# Patient Record
Sex: Female | Born: 1965
Health system: Southern US, Community
[De-identification: ages and names within clinical notes are randomized; demographics above are authoritative.]

## PROBLEM LIST (undated history)

## (undated) DIAGNOSIS — E669 Obesity, unspecified: Secondary | ICD-10-CM

## (undated) DIAGNOSIS — F32A Depression, unspecified: Secondary | ICD-10-CM

## (undated) DIAGNOSIS — Z79891 Long term (current) use of opiate analgesic: Secondary | ICD-10-CM

## (undated) DIAGNOSIS — M797 Fibromyalgia: Secondary | ICD-10-CM

## (undated) DIAGNOSIS — F909 Attention-deficit hyperactivity disorder, unspecified type: Secondary | ICD-10-CM

## (undated) DIAGNOSIS — I10 Essential (primary) hypertension: Secondary | ICD-10-CM

## (undated) DIAGNOSIS — E785 Hyperlipidemia, unspecified: Secondary | ICD-10-CM

## (undated) DIAGNOSIS — E8881 Metabolic syndrome: Secondary | ICD-10-CM

## (undated) DIAGNOSIS — K219 Gastro-esophageal reflux disease without esophagitis: Secondary | ICD-10-CM

## (undated) DIAGNOSIS — F329 Major depressive disorder, single episode, unspecified: Secondary | ICD-10-CM

## (undated) DIAGNOSIS — G4730 Sleep apnea, unspecified: Secondary | ICD-10-CM

## (undated) DIAGNOSIS — G43909 Migraine, unspecified, not intractable, without status migrainosus: Secondary | ICD-10-CM

## (undated) HISTORY — DX: Metabolic syndrome: E88.81

## (undated) HISTORY — DX: Depression, unspecified: F32.A

## (undated) HISTORY — DX: Migraine, unspecified, not intractable, without status migrainosus: G43.909

## (undated) HISTORY — DX: Hyperlipidemia, unspecified: E78.5

## (undated) HISTORY — DX: Gastro-esophageal reflux disease without esophagitis: K21.9

## (undated) HISTORY — DX: Essential (primary) hypertension: I10

## (undated) HISTORY — DX: Obesity, unspecified: E66.9

## (undated) HISTORY — DX: Major depressive disorder, single episode, unspecified: F32.9

## (undated) HISTORY — DX: Sleep apnea, unspecified: G47.30

## (undated) HISTORY — DX: Long term (current) use of opiate analgesic: Z79.891

## (undated) HISTORY — DX: Attention-deficit hyperactivity disorder, unspecified type: F90.9

## (undated) HISTORY — DX: Fibromyalgia: M79.7

---

## 2001-02-03 ENCOUNTER — Other Ambulatory Visit: Admission: RE | Admit: 2001-02-03 | Discharge: 2001-02-03 | Payer: Self-pay | Admitting: Family Medicine

## 2001-06-18 HISTORY — PX: OTHER SURGICAL HISTORY: SHX169

## 2002-05-06 ENCOUNTER — Ambulatory Visit (HOSPITAL_COMMUNITY): Admission: RE | Admit: 2002-05-06 | Discharge: 2002-05-06 | Payer: Self-pay | Admitting: Family Medicine

## 2002-05-06 ENCOUNTER — Encounter: Payer: Self-pay | Admitting: Family Medicine

## 2002-05-12 ENCOUNTER — Ambulatory Visit (HOSPITAL_COMMUNITY): Admission: RE | Admit: 2002-05-12 | Discharge: 2002-05-12 | Payer: Self-pay | Admitting: Interventional Radiology

## 2002-05-22 ENCOUNTER — Inpatient Hospital Stay (HOSPITAL_COMMUNITY): Admission: RE | Admit: 2002-05-22 | Discharge: 2002-05-22 | Payer: Self-pay | Admitting: Interventional Radiology

## 2002-07-31 ENCOUNTER — Ambulatory Visit (HOSPITAL_COMMUNITY): Admission: RE | Admit: 2002-07-31 | Discharge: 2002-07-31 | Payer: Self-pay | Admitting: Interventional Radiology

## 2002-09-01 ENCOUNTER — Ambulatory Visit (HOSPITAL_COMMUNITY): Admission: RE | Admit: 2002-09-01 | Discharge: 2002-09-01 | Payer: Self-pay | Admitting: Interventional Radiology

## 2002-09-29 ENCOUNTER — Other Ambulatory Visit: Admission: RE | Admit: 2002-09-29 | Discharge: 2002-09-29 | Payer: Self-pay | Admitting: *Deleted

## 2002-11-16 ENCOUNTER — Ambulatory Visit (HOSPITAL_COMMUNITY): Admission: RE | Admit: 2002-11-16 | Discharge: 2002-11-16 | Payer: Self-pay | Admitting: Interventional Radiology

## 2003-03-05 ENCOUNTER — Ambulatory Visit (HOSPITAL_COMMUNITY): Admission: RE | Admit: 2003-03-05 | Discharge: 2003-03-05 | Payer: Self-pay | Admitting: Interventional Radiology

## 2003-06-04 ENCOUNTER — Ambulatory Visit (HOSPITAL_COMMUNITY): Admission: RE | Admit: 2003-06-04 | Discharge: 2003-06-04 | Payer: Self-pay | Admitting: Interventional Radiology

## 2003-12-31 ENCOUNTER — Ambulatory Visit (HOSPITAL_COMMUNITY): Admission: RE | Admit: 2003-12-31 | Discharge: 2003-12-31 | Payer: Self-pay | Admitting: Interventional Radiology

## 2004-09-11 ENCOUNTER — Ambulatory Visit (HOSPITAL_COMMUNITY): Admission: RE | Admit: 2004-09-11 | Discharge: 2004-09-11 | Payer: Self-pay | Admitting: Interventional Radiology

## 2005-01-02 ENCOUNTER — Ambulatory Visit (HOSPITAL_COMMUNITY): Admission: RE | Admit: 2005-01-02 | Discharge: 2005-01-02 | Payer: Self-pay | Admitting: Interventional Radiology

## 2005-06-20 ENCOUNTER — Ambulatory Visit (HOSPITAL_COMMUNITY): Admission: RE | Admit: 2005-06-20 | Discharge: 2005-06-20 | Payer: Self-pay | Admitting: Interventional Radiology

## 2006-07-18 ENCOUNTER — Ambulatory Visit (HOSPITAL_COMMUNITY): Admission: RE | Admit: 2006-07-18 | Discharge: 2006-07-18 | Payer: Self-pay | Admitting: Interventional Radiology

## 2010-10-04 ENCOUNTER — Ambulatory Visit (HOSPITAL_COMMUNITY)
Admission: RE | Admit: 2010-10-04 | Discharge: 2010-10-04 | Disposition: A | Discharge status: Home / Self Care | Payer: BC Managed Care – PPO | Source: Ambulatory Visit | Attending: Interventional Radiology | Admitting: Interventional Radiology

## 2010-10-04 ENCOUNTER — Other Ambulatory Visit (HOSPITAL_COMMUNITY): Payer: Self-pay | Admitting: Interventional Radiology

## 2010-10-04 DIAGNOSIS — G44009 Cluster headache syndrome, unspecified, not intractable: Secondary | ICD-10-CM

## 2010-10-04 DIAGNOSIS — I729 Aneurysm of unspecified site: Secondary | ICD-10-CM

## 2010-10-04 DIAGNOSIS — R51 Headache: Secondary | ICD-10-CM | POA: Insufficient documentation

## 2010-10-04 DIAGNOSIS — G932 Benign intracranial hypertension: Secondary | ICD-10-CM

## 2010-10-04 DIAGNOSIS — I671 Cerebral aneurysm, nonruptured: Secondary | ICD-10-CM | POA: Insufficient documentation

## 2010-10-04 LAB — BUN: BUN: 11 mg/dL (ref 6–23)

## 2010-10-09 ENCOUNTER — Ambulatory Visit (HOSPITAL_COMMUNITY): Admission: RE | Admit: 2010-10-09 | Payer: BC Managed Care – PPO | Source: Ambulatory Visit

## 2010-10-26 ENCOUNTER — Other Ambulatory Visit (HOSPITAL_COMMUNITY): Payer: Self-pay | Admitting: Interventional Radiology

## 2010-10-26 DIAGNOSIS — I729 Aneurysm of unspecified site: Secondary | ICD-10-CM

## 2010-10-30 ENCOUNTER — Ambulatory Visit (HOSPITAL_COMMUNITY): Payer: BC Managed Care – PPO

## 2010-10-30 ENCOUNTER — Ambulatory Visit (HOSPITAL_COMMUNITY): Admission: RE | Admit: 2010-10-30 | Payer: BC Managed Care – PPO | Source: Ambulatory Visit

## 2016-08-09 DIAGNOSIS — E782 Mixed hyperlipidemia: Secondary | ICD-10-CM | POA: Diagnosis not present

## 2016-08-09 DIAGNOSIS — F900 Attention-deficit hyperactivity disorder, predominantly inattentive type: Secondary | ICD-10-CM | POA: Diagnosis not present

## 2016-08-09 DIAGNOSIS — Z9189 Other specified personal risk factors, not elsewhere classified: Secondary | ICD-10-CM | POA: Diagnosis not present

## 2016-08-09 DIAGNOSIS — F331 Major depressive disorder, recurrent, moderate: Secondary | ICD-10-CM | POA: Diagnosis not present

## 2016-08-09 DIAGNOSIS — I10 Essential (primary) hypertension: Secondary | ICD-10-CM | POA: Diagnosis not present

## 2016-08-09 DIAGNOSIS — K210 Gastro-esophageal reflux disease with esophagitis: Secondary | ICD-10-CM | POA: Diagnosis not present

## 2016-08-09 DIAGNOSIS — G4733 Obstructive sleep apnea (adult) (pediatric): Secondary | ICD-10-CM | POA: Diagnosis not present

## 2016-08-09 DIAGNOSIS — E8881 Metabolic syndrome: Secondary | ICD-10-CM | POA: Diagnosis not present

## 2016-08-14 DIAGNOSIS — F314 Bipolar disorder, current episode depressed, severe, without psychotic features: Secondary | ICD-10-CM | POA: Diagnosis not present

## 2016-09-04 DIAGNOSIS — L570 Actinic keratosis: Secondary | ICD-10-CM | POA: Diagnosis not present

## 2016-10-02 DIAGNOSIS — F314 Bipolar disorder, current episode depressed, severe, without psychotic features: Secondary | ICD-10-CM | POA: Diagnosis not present

## 2016-11-06 DIAGNOSIS — F331 Major depressive disorder, recurrent, moderate: Secondary | ICD-10-CM | POA: Diagnosis not present

## 2016-11-06 DIAGNOSIS — E782 Mixed hyperlipidemia: Secondary | ICD-10-CM | POA: Diagnosis not present

## 2016-11-06 DIAGNOSIS — Z6839 Body mass index (BMI) 39.0-39.9, adult: Secondary | ICD-10-CM | POA: Diagnosis not present

## 2016-11-06 DIAGNOSIS — I10 Essential (primary) hypertension: Secondary | ICD-10-CM | POA: Diagnosis not present

## 2016-11-06 DIAGNOSIS — E6609 Other obesity due to excess calories: Secondary | ICD-10-CM | POA: Diagnosis not present

## 2016-11-06 DIAGNOSIS — Z23 Encounter for immunization: Secondary | ICD-10-CM | POA: Diagnosis not present

## 2016-11-06 DIAGNOSIS — K219 Gastro-esophageal reflux disease without esophagitis: Secondary | ICD-10-CM | POA: Diagnosis not present

## 2016-11-06 DIAGNOSIS — G4733 Obstructive sleep apnea (adult) (pediatric): Secondary | ICD-10-CM | POA: Diagnosis not present

## 2016-11-06 DIAGNOSIS — E8881 Metabolic syndrome: Secondary | ICD-10-CM | POA: Diagnosis not present

## 2016-11-06 DIAGNOSIS — Z9189 Other specified personal risk factors, not elsewhere classified: Secondary | ICD-10-CM | POA: Diagnosis not present

## 2016-11-06 DIAGNOSIS — F900 Attention-deficit hyperactivity disorder, predominantly inattentive type: Secondary | ICD-10-CM | POA: Diagnosis not present

## 2016-11-06 DIAGNOSIS — M797 Fibromyalgia: Secondary | ICD-10-CM | POA: Diagnosis not present

## 2016-11-13 DIAGNOSIS — F314 Bipolar disorder, current episode depressed, severe, without psychotic features: Secondary | ICD-10-CM | POA: Diagnosis not present

## 2017-02-25 DIAGNOSIS — F314 Bipolar disorder, current episode depressed, severe, without psychotic features: Secondary | ICD-10-CM | POA: Diagnosis not present

## 2017-06-06 DIAGNOSIS — Z6839 Body mass index (BMI) 39.0-39.9, adult: Secondary | ICD-10-CM | POA: Diagnosis not present

## 2017-06-06 DIAGNOSIS — M722 Plantar fascial fibromatosis: Secondary | ICD-10-CM | POA: Diagnosis not present

## 2017-08-22 ENCOUNTER — Other Ambulatory Visit (HOSPITAL_COMMUNITY): Payer: Self-pay | Admitting: Family Medicine

## 2017-08-29 ENCOUNTER — Other Ambulatory Visit (HOSPITAL_COMMUNITY): Payer: Self-pay | Admitting: Family Medicine

## 2017-08-29 DIAGNOSIS — R51 Headache: Principal | ICD-10-CM

## 2017-08-29 DIAGNOSIS — R519 Headache, unspecified: Secondary | ICD-10-CM

## 2017-09-04 ENCOUNTER — Ambulatory Visit (HOSPITAL_COMMUNITY)
Admission: RE | Admit: 2017-09-04 | Discharge: 2017-09-04 | Disposition: A | Discharge status: Home / Self Care | Payer: Medicare PPO | Source: Ambulatory Visit | Attending: Family Medicine | Admitting: Family Medicine

## 2017-09-04 DIAGNOSIS — R519 Headache, unspecified: Secondary | ICD-10-CM

## 2017-09-04 DIAGNOSIS — G43919 Migraine, unspecified, intractable, without status migrainosus: Secondary | ICD-10-CM | POA: Diagnosis present

## 2017-09-04 DIAGNOSIS — R51 Headache: Secondary | ICD-10-CM

## 2017-09-04 DIAGNOSIS — I671 Cerebral aneurysm, nonruptured: Secondary | ICD-10-CM | POA: Insufficient documentation

## 2017-09-04 DIAGNOSIS — R9082 White matter disease, unspecified: Secondary | ICD-10-CM | POA: Diagnosis not present

## 2017-11-12 DIAGNOSIS — E8881 Metabolic syndrome: Secondary | ICD-10-CM | POA: Diagnosis not present

## 2017-11-12 DIAGNOSIS — Z79891 Long term (current) use of opiate analgesic: Secondary | ICD-10-CM | POA: Diagnosis not present

## 2017-11-12 DIAGNOSIS — F331 Major depressive disorder, recurrent, moderate: Secondary | ICD-10-CM | POA: Diagnosis not present

## 2017-11-12 DIAGNOSIS — I10 Essential (primary) hypertension: Secondary | ICD-10-CM | POA: Diagnosis not present

## 2017-11-12 DIAGNOSIS — K210 Gastro-esophageal reflux disease with esophagitis: Secondary | ICD-10-CM | POA: Diagnosis not present

## 2017-11-12 DIAGNOSIS — E782 Mixed hyperlipidemia: Secondary | ICD-10-CM | POA: Diagnosis not present

## 2017-11-12 DIAGNOSIS — G4733 Obstructive sleep apnea (adult) (pediatric): Secondary | ICD-10-CM | POA: Diagnosis not present

## 2017-11-12 DIAGNOSIS — Z9189 Other specified personal risk factors, not elsewhere classified: Secondary | ICD-10-CM | POA: Diagnosis not present

## 2017-11-14 DIAGNOSIS — F331 Major depressive disorder, recurrent, moderate: Secondary | ICD-10-CM | POA: Diagnosis not present

## 2017-11-14 DIAGNOSIS — I10 Essential (primary) hypertension: Secondary | ICD-10-CM | POA: Diagnosis not present

## 2017-11-14 DIAGNOSIS — E8881 Metabolic syndrome: Secondary | ICD-10-CM | POA: Diagnosis not present

## 2017-11-14 DIAGNOSIS — K219 Gastro-esophageal reflux disease without esophagitis: Secondary | ICD-10-CM | POA: Diagnosis not present

## 2017-11-14 DIAGNOSIS — R072 Precordial pain: Secondary | ICD-10-CM | POA: Diagnosis not present

## 2017-11-14 DIAGNOSIS — Z1389 Encounter for screening for other disorder: Secondary | ICD-10-CM | POA: Diagnosis not present

## 2017-11-14 DIAGNOSIS — M797 Fibromyalgia: Secondary | ICD-10-CM | POA: Diagnosis not present

## 2017-11-14 DIAGNOSIS — Z6835 Body mass index (BMI) 35.0-35.9, adult: Secondary | ICD-10-CM | POA: Diagnosis not present

## 2017-11-14 DIAGNOSIS — G4733 Obstructive sleep apnea (adult) (pediatric): Secondary | ICD-10-CM | POA: Diagnosis not present

## 2017-11-14 DIAGNOSIS — F900 Attention-deficit hyperactivity disorder, predominantly inattentive type: Secondary | ICD-10-CM | POA: Diagnosis not present

## 2017-11-14 DIAGNOSIS — Z1331 Encounter for screening for depression: Secondary | ICD-10-CM | POA: Diagnosis not present

## 2017-11-14 DIAGNOSIS — Z9189 Other specified personal risk factors, not elsewhere classified: Secondary | ICD-10-CM | POA: Diagnosis not present

## 2017-11-14 DIAGNOSIS — E782 Mixed hyperlipidemia: Secondary | ICD-10-CM | POA: Diagnosis not present

## 2017-11-19 DIAGNOSIS — Z1211 Encounter for screening for malignant neoplasm of colon: Secondary | ICD-10-CM | POA: Diagnosis not present

## 2017-11-19 DIAGNOSIS — K219 Gastro-esophageal reflux disease without esophagitis: Secondary | ICD-10-CM | POA: Diagnosis not present

## 2017-11-26 DIAGNOSIS — Z79891 Long term (current) use of opiate analgesic: Secondary | ICD-10-CM | POA: Diagnosis not present

## 2017-11-26 DIAGNOSIS — E8881 Metabolic syndrome: Secondary | ICD-10-CM | POA: Diagnosis not present

## 2017-12-13 DIAGNOSIS — E782 Mixed hyperlipidemia: Secondary | ICD-10-CM | POA: Diagnosis not present

## 2017-12-13 DIAGNOSIS — F900 Attention-deficit hyperactivity disorder, predominantly inattentive type: Secondary | ICD-10-CM | POA: Diagnosis not present

## 2017-12-13 DIAGNOSIS — E8881 Metabolic syndrome: Secondary | ICD-10-CM | POA: Diagnosis not present

## 2017-12-13 DIAGNOSIS — I10 Essential (primary) hypertension: Secondary | ICD-10-CM | POA: Diagnosis not present

## 2017-12-18 ENCOUNTER — Encounter: Payer: Self-pay | Admitting: *Deleted

## 2017-12-20 ENCOUNTER — Encounter: Payer: Self-pay | Admitting: *Deleted

## 2017-12-20 ENCOUNTER — Ambulatory Visit: Payer: Medicare PPO | Admitting: Cardiology

## 2017-12-20 ENCOUNTER — Other Ambulatory Visit: Payer: Self-pay

## 2017-12-20 ENCOUNTER — Encounter: Payer: Self-pay | Admitting: Cardiology

## 2017-12-20 ENCOUNTER — Telehealth: Payer: Self-pay | Admitting: Cardiology

## 2017-12-20 VITALS — BP 118/80 | HR 73 | Ht 65.0 in | Wt 216.0 lb

## 2017-12-20 DIAGNOSIS — I10 Essential (primary) hypertension: Secondary | ICD-10-CM

## 2017-12-20 DIAGNOSIS — R0789 Other chest pain: Secondary | ICD-10-CM

## 2017-12-20 DIAGNOSIS — E782 Mixed hyperlipidemia: Secondary | ICD-10-CM

## 2017-12-20 DIAGNOSIS — Z8249 Family history of ischemic heart disease and other diseases of the circulatory system: Secondary | ICD-10-CM | POA: Diagnosis not present

## 2017-12-20 NOTE — Telephone Encounter (Signed)
Pre-cert Verification for the following procedure   [facility]isc[facility] at [facility]

## 2017-12-20 NOTE — Progress Notes (Signed)
Cardiology Office Note  Date: 12/20/2017   ID: ORION VANDERVORT, DOB 1965-08-19, MRN [id number]  PCP: Richardean Chimera, MD  Consulting Cardiologist: Nona Dell, MD   Chief Complaint  Patient presents with  . Chest Pain    History of Present Illness: Melanie Case is a 52 y.o. female referred for cardiology consultation by Dr. Reuel Boom for the assessment of chest pain.  She states that for several years she has had intermittent chest discomfort, describes a sharp central sternal pain, sometimes occurs after meals, sometimes when she is upset or rushing around.  She states that she was seen by a gastroenterologist in the past and told that she had "spasms," presumably esophageal.  She also underwent stress testing perhaps 15 years ago with reportedly reassuring findings.  Cardiac risk factors include hypertension and hyperlipidemia.  He states that her brother died with premature CAD.  She has a personal history of left MCA aneurysm status post coiling, no other vascular anomalies.  I reviewed her recent ECG which was normal.  I also went over her lab work.  LDL was 86.  Past Medical History:  Diagnosis Date  . ADHD (attention deficit hyperactivity disorder)   . Depression   . Fibromyalgia   . GERD (gastroesophageal reflux disease)   . Hyperlipidemia   . Hypertension   . Long term (current) use of opiate analgesic   . Metabolic syndrome   . Migraines   . Obesity   . Sleep apnea     Past Surgical History:  Procedure Laterality Date  . Left MCA aneurysm coiling  2003    Current Outpatient Medications  Medication Sig Dispense Refill  . amphetamine-dextroamphetamine (ADDERALL) 20 MG tablet Take 20 mg by mouth 2 (two) times daily.    Marland Kitchen atenolol (TENORMIN) 50 MG tablet Take 50 mg by mouth daily.    Marland Kitchen buPROPion (WELLBUTRIN XL) 150 MG 24 hr tablet Take 450 mg by mouth daily. (3 tabs)    . clonazePAM (KLONOPIN) 0.5 MG tablet Take 0.5 mg by mouth 3 (three) times daily as  needed for anxiety.    Marland Kitchen desvenlafaxine (PRISTIQ) 50 MG 24 hr tablet Take 50 mg by mouth at bedtime.    Marland Kitchen HYDROcodone-acetaminophen (NORCO) 7.5-325 MG tablet Take 1 tablet by mouth every 6 (six) hours as needed for moderate pain.    Marland Kitchen ibuprofen (ADVIL,MOTRIN) 200 MG tablet Take 200 mg by mouth 2 (two) times daily.    Marland Kitchen loratadine (CLARITIN) 10 MG tablet Take 10 mg by mouth daily.    Marland Kitchen omeprazole (PRILOSEC) 40 MG capsule Take 40 mg by mouth daily.  4  . simvastatin (ZOCOR) 40 MG tablet Take 40 mg by mouth daily.    Marland Kitchen topiramate (TOPAMAX) 100 MG tablet Take 100 mg by mouth 2 (two) times daily.    Marland Kitchen triamcinolone cream (KENALOG) 0.1 % Apply 1 application topically 3 (three) times daily.     No current facility-administered medications for this visit.    Allergies:  Patient has no known allergies.   Social History: The patient  reports that she quit smoking about 16 years ago. Her smoking use included cigarettes. She has never used smokeless tobacco. She reports that she drinks alcohol.   Family History: The patient's family history includes CAD in her brother; Hypertension in her brother.   ROS:  Please see the history of present illness. Otherwise, complete review of systems is positive for anxiety and depression.  All other systems are reviewed and  negative.   Physical Exam: VS:  BP 118/80   Pulse 73   Ht 5\' 5"  (1.651 m)   Wt 216 lb (98 kg)   SpO2 96%   BMI 35.94 kg/m , BMI Body mass index is 35.94 kg/m.  Wt Readings from Last 3 Encounters:  12/20/17 216 lb (98 kg)  11/14/17 216 lb (98 kg)    General: Overweight woman, appears comfortable at rest. HEENT: Conjunctiva and lids normal, oropharynx clear. Neck: Supple, no elevated JVP or carotid bruits, no thyromegaly. Lungs: Clear to auscultation, nonlabored breathing at rest. Cardiac: Regular rate and rhythm, no S3 or significant systolic murmur, no pericardial rub. Abdomen: Soft, nontender, bowel sounds present. Extremities: No  pitting edema, distal pulses 2+. Skin: Warm and dry. Musculoskeletal: No kyphosis. Neuropsychiatric: Alert and oriented x3, affect grossly appropriate.  ECG: I personally reviewed the tracing from 11/14/2017 which shows normal sinus rhythm.  Recent Labwork:  May 2019: Hgb13.4, platelets 240, cholesterol 161, triglycerides 148, HDL 45, LDL 86, BUN 13, creatinine 0.87, potassium 4.3, AST 13, ALT 16, TSH 4.24  Assessment and Plan:  1.  Atypical chest pain as outlined above.  Description does sound like this could be related to esophageal spasm, but she does have cardiac risk factors including hypertension, hyperlipidemia, and premature CAD in her brother who died with a heart attack.  She has a more remote history of tobacco use.  Recent LDL was 86 on Zocor.  Blood pressure is well controlled.  We will obtain a Lexiscan Myoview for further ischemic evaluation.  2.  Hyperlipidemia, on Zocor.  LDL 86.  3.  Essential hypertension, on atenolol.  Blood pressure is well controlled today.  4.  History of left MCA aneurysm status post coiling.  Current medicines were reviewed with the patient today.  Disposition: Call with test results.  Signed, Jonelle SidleSamuel G. Khyla Mccumbers, MD, [facility] 12/20/2017 2:01 PM    [facility] [address], [place], [place] [address] Phone: [phone]; Fax: [phone]

## 2017-12-20 NOTE — Patient Instructions (Addendum)
Medication Instructions:   Your physician recommends that you continue on your current medications as directed. Please refer to the Current Medication list given to you today.  Labwork:  NONE  Testing/Procedures: Your physician has requested that you have a lexiscan myoview. For further information please visit [email or web address]. Please follow instruction sheet, as given.  Follow-Up:  Your physician recommends that you schedule a follow-up appointment in: pending test result.  Any Other Special Instructions Will Be Listed Below (If Applicable).  If you need a refill on your cardiac medications before your next appointment, please call your pharmacy. 

## 2017-12-25 DIAGNOSIS — H40053 Ocular hypertension, bilateral: Secondary | ICD-10-CM | POA: Diagnosis not present

## 2017-12-25 DIAGNOSIS — H35033 Hypertensive retinopathy, bilateral: Secondary | ICD-10-CM | POA: Diagnosis not present

## 2017-12-26 ENCOUNTER — Other Ambulatory Visit: Payer: Self-pay

## 2017-12-26 DIAGNOSIS — R079 Chest pain, unspecified: Secondary | ICD-10-CM

## 2017-12-27 ENCOUNTER — Encounter (HOSPITAL_COMMUNITY): Payer: Self-pay

## 2017-12-27 ENCOUNTER — Encounter (HOSPITAL_BASED_OUTPATIENT_CLINIC_OR_DEPARTMENT_OTHER)
Admission: RE | Admit: 2017-12-27 | Discharge: 2017-12-27 | Disposition: A | Discharge status: Home / Self Care | Payer: Medicare PPO | Source: Ambulatory Visit | Attending: Cardiology | Admitting: Cardiology

## 2017-12-27 ENCOUNTER — Encounter (HOSPITAL_COMMUNITY)
Admission: RE | Admit: 2017-12-27 | Discharge: 2017-12-27 | Disposition: A | Discharge status: Home / Self Care | Payer: Medicare PPO | Source: Ambulatory Visit | Attending: Cardiology | Admitting: Cardiology

## 2017-12-27 DIAGNOSIS — R079 Chest pain, unspecified: Secondary | ICD-10-CM | POA: Insufficient documentation

## 2017-12-27 LAB — NM MYOCAR MULTI W/SPECT W/WALL MOTION / EF
CHL CUP NUCLEAR SDS: 3
CHL CUP RESTING HR STRESS: 65 {beats}/min
LHR: 0.46
LV dias vol: 71 mL (ref 46–106)
LVSYSVOL: 24 mL
Peak HR: 93 {beats}/min
SRS: 0
SSS: 3
TID: 1.16

## 2017-12-27 MED ORDER — REGADENOSON 0.4 MG/5ML IV SOLN
INTRAVENOUS | Status: AC
Start: 2017-12-27 — End: 2017-12-27
  Administered 2017-12-27: 0.4 mg via INTRAVENOUS
  Filled 2017-12-27: qty 5

## 2017-12-27 MED ORDER — TECHNETIUM TC 99M TETROFOSMIN IV KIT
30.0000 | PACK | Freq: Once | INTRAVENOUS | Status: AC | PRN
  Administered 2017-12-27: 30 via INTRAVENOUS

## 2017-12-27 MED ORDER — SODIUM CHLORIDE 0.9% FLUSH
INTRAVENOUS | Status: AC
  Administered 2017-12-27: 10 mL via INTRAVENOUS
  Filled 2017-12-27: qty 10

## 2017-12-27 MED ORDER — TECHNETIUM TC 99M TETROFOSMIN IV KIT
10.0000 | PACK | Freq: Once | INTRAVENOUS | Status: AC | PRN
  Administered 2017-12-27: 10 via INTRAVENOUS

## 2017-12-30 ENCOUNTER — Telehealth: Payer: Self-pay | Admitting: *Deleted

## 2017-12-30 NOTE — Telephone Encounter (Signed)
Patient informed and copy sent to PCP. 

## 2017-12-30 NOTE — Telephone Encounter (Signed)
-----   Message from Jonelle SidleSamuel G McDowell, MD sent at 12/27/2017  4:43 PM EDT ----- Results reviewed.  Please let her know that the stress test was low risk, no clear evidence of obstructive coronary artery disease and normal LVEF.  Keep follow-up with PCP. A copy of this test should be forwarded to Richardean Chimeraaniel, Terry G, MD.

## 2017-12-31 DIAGNOSIS — K649 Unspecified hemorrhoids: Secondary | ICD-10-CM | POA: Diagnosis not present

## 2017-12-31 DIAGNOSIS — Z01419 Encounter for gynecological examination (general) (routine) without abnormal findings: Secondary | ICD-10-CM | POA: Diagnosis not present

## 2018-01-29 DIAGNOSIS — K449 Diaphragmatic hernia without obstruction or gangrene: Secondary | ICD-10-CM | POA: Diagnosis not present

## 2018-01-29 DIAGNOSIS — Z1211 Encounter for screening for malignant neoplasm of colon: Secondary | ICD-10-CM | POA: Diagnosis not present

## 2018-01-29 DIAGNOSIS — K219 Gastro-esophageal reflux disease without esophagitis: Secondary | ICD-10-CM | POA: Diagnosis not present

## 2018-01-29 DIAGNOSIS — R0789 Other chest pain: Secondary | ICD-10-CM | POA: Diagnosis not present

## 2018-02-11 DIAGNOSIS — F900 Attention-deficit hyperactivity disorder, predominantly inattentive type: Secondary | ICD-10-CM | POA: Diagnosis not present

## 2018-02-11 DIAGNOSIS — E782 Mixed hyperlipidemia: Secondary | ICD-10-CM | POA: Diagnosis not present

## 2018-02-11 DIAGNOSIS — Z6834 Body mass index (BMI) 34.0-34.9, adult: Secondary | ICD-10-CM | POA: Diagnosis not present

## 2018-02-11 DIAGNOSIS — M797 Fibromyalgia: Secondary | ICD-10-CM | POA: Diagnosis not present

## 2018-02-11 DIAGNOSIS — K219 Gastro-esophageal reflux disease without esophagitis: Secondary | ICD-10-CM | POA: Diagnosis not present

## 2018-02-11 DIAGNOSIS — Z79891 Long term (current) use of opiate analgesic: Secondary | ICD-10-CM | POA: Diagnosis not present

## 2018-02-11 DIAGNOSIS — Z9189 Other specified personal risk factors, not elsewhere classified: Secondary | ICD-10-CM | POA: Diagnosis not present

## 2018-02-11 DIAGNOSIS — I10 Essential (primary) hypertension: Secondary | ICD-10-CM | POA: Diagnosis not present

## 2018-02-11 DIAGNOSIS — G4733 Obstructive sleep apnea (adult) (pediatric): Secondary | ICD-10-CM | POA: Diagnosis not present

## 2018-02-11 DIAGNOSIS — E8881 Metabolic syndrome: Secondary | ICD-10-CM | POA: Diagnosis not present

## 2018-02-11 DIAGNOSIS — Z1389 Encounter for screening for other disorder: Secondary | ICD-10-CM | POA: Diagnosis not present

## 2018-02-11 DIAGNOSIS — F331 Major depressive disorder, recurrent, moderate: Secondary | ICD-10-CM | POA: Diagnosis not present

## 2018-02-25 DIAGNOSIS — L853 Xerosis cutis: Secondary | ICD-10-CM | POA: Diagnosis not present

## 2018-02-25 DIAGNOSIS — L821 Other seborrheic keratosis: Secondary | ICD-10-CM | POA: Diagnosis not present

## 2018-02-25 DIAGNOSIS — L570 Actinic keratosis: Secondary | ICD-10-CM | POA: Diagnosis not present

## 2018-03-04 DIAGNOSIS — Z86010 Personal history of colonic polyps: Secondary | ICD-10-CM | POA: Diagnosis not present

## 2018-03-04 DIAGNOSIS — K219 Gastro-esophageal reflux disease without esophagitis: Secondary | ICD-10-CM | POA: Diagnosis not present

## 2018-03-04 DIAGNOSIS — K644 Residual hemorrhoidal skin tags: Secondary | ICD-10-CM | POA: Diagnosis not present

## 2018-03-26 DIAGNOSIS — K5732 Diverticulitis of large intestine without perforation or abscess without bleeding: Secondary | ICD-10-CM | POA: Diagnosis not present

## 2018-03-26 DIAGNOSIS — Z6834 Body mass index (BMI) 34.0-34.9, adult: Secondary | ICD-10-CM | POA: Diagnosis not present

## 2018-03-27 DIAGNOSIS — F319 Bipolar disorder, unspecified: Secondary | ICD-10-CM

## 2018-04-03 ENCOUNTER — Other Ambulatory Visit: Payer: Self-pay | Admitting: Psychiatry

## 2018-04-08 DIAGNOSIS — R300 Dysuria: Secondary | ICD-10-CM | POA: Diagnosis not present

## 2018-04-08 DIAGNOSIS — R1032 Left lower quadrant pain: Secondary | ICD-10-CM | POA: Diagnosis not present

## 2018-04-08 DIAGNOSIS — Z6833 Body mass index (BMI) 33.0-33.9, adult: Secondary | ICD-10-CM | POA: Diagnosis not present

## 2018-04-10 ENCOUNTER — Ambulatory Visit: Payer: Self-pay | Admitting: Psychiatry

## 2018-04-15 DIAGNOSIS — Z9049 Acquired absence of other specified parts of digestive tract: Secondary | ICD-10-CM | POA: Diagnosis not present

## 2018-04-15 DIAGNOSIS — R300 Dysuria: Secondary | ICD-10-CM | POA: Diagnosis not present

## 2018-04-15 DIAGNOSIS — R1032 Left lower quadrant pain: Secondary | ICD-10-CM | POA: Diagnosis not present

## 2018-04-16 DIAGNOSIS — F331 Major depressive disorder, recurrent, moderate: Secondary | ICD-10-CM | POA: Diagnosis not present

## 2018-04-16 DIAGNOSIS — E782 Mixed hyperlipidemia: Secondary | ICD-10-CM | POA: Diagnosis not present

## 2018-04-16 DIAGNOSIS — I10 Essential (primary) hypertension: Secondary | ICD-10-CM | POA: Diagnosis not present

## 2018-04-22 ENCOUNTER — Encounter: Payer: Self-pay | Admitting: Psychiatry

## 2018-04-22 ENCOUNTER — Ambulatory Visit: Payer: Medicare PPO | Admitting: Psychiatry

## 2018-04-22 DIAGNOSIS — F4001 Agoraphobia with panic disorder: Secondary | ICD-10-CM

## 2018-04-22 DIAGNOSIS — F902 Attention-deficit hyperactivity disorder, combined type: Secondary | ICD-10-CM

## 2018-04-22 DIAGNOSIS — F331 Major depressive disorder, recurrent, moderate: Secondary | ICD-10-CM

## 2018-04-22 NOTE — Progress Notes (Signed)
Melanie Case [id number] 15-Feb-1966 52 y.o.  Subjective:   Patient ID:  Melanie Case is a 52 y.o. (DOB November 20, 1965) female.  Chief Complaint:  Chief Complaint  Patient presents with  . Depression  . Anxiety    HPI Melanie Case presents to the office today for follow-up of TRD. Very sensitive emotionally and always has been that way.  Sister moved back and is stressful to deal with. Trying to do more projects in the house.  First time in 5 years she'll have company for the holidays DT depression. Pt reports that mood is Anxious and stressed with sister. and describes anxiety as Moderate. Anxiety symptoms include: Excessive Worry,. Pt reports no sleep issues. Pt reports that appetite is good. Pt reports that energy is good and good. Concentration is good. Suicidal thoughts:  denied by patient.  Energy fair DT FM.  Slow in am.  Has had anxiety about driving and just started driving around town, not here. Excited about the holidays for the first time in year.s.  She does not want changes.  On Adderall off and on from PCP for focus.  Not sure when she first started stimulants for focus, but for years when she was working DT distractibility.    Review of Systems:  Review of Systems  Gastrointestinal: Positive for abdominal pain.  Neurological: Negative for tremors and weakness.  Psychiatric/Behavioral: Negative for agitation, behavioral problems, confusion, decreased concentration, dysphoric mood, hallucinations, self-injury, sleep disturbance and suicidal ideas. The patient is not nervous/anxious and is not hyperactive.     Medications: I have reviewed the patient's current medications.  Current Outpatient Medications  Medication Sig Dispense Refill  . amphetamine-dextroamphetamine (ADDERALL) 20 MG tablet Take 20 mg by mouth 2 (two) times daily.    Marland Kitchen atenolol (TENORMIN) 50 MG tablet Take 50 mg by mouth daily.    Marland Kitchen buPROPion (WELLBUTRIN XL) 150 MG 24 hr tablet TAKE 3 TABLETS  BY MOUTH IN THE MORNING 90 tablet 0  . clonazePAM (KLONOPIN) 0.5 MG tablet Take 0.5 mg by mouth 3 (three) times daily as needed for anxiety.    Marland Kitchen desvenlafaxine (PRISTIQ) 100 MG 24 hr tablet TAKE 1 TABLET BY MOUTH ONCE DAILY 30 tablet 0  . HYDROcodone-acetaminophen (NORCO) 7.5-325 MG tablet Take 1 tablet by mouth every 6 (six) hours as needed for moderate pain.    . simvastatin (ZOCOR) 40 MG tablet Take 40 mg by mouth daily.    Marland Kitchen topiramate (TOPAMAX) 100 MG tablet Take 100 mg by mouth 2 (two) times daily.    Marland Kitchen triamcinolone cream (KENALOG) 0.1 % Apply 1 application topically 3 (three) times daily.    Marland Kitchen ibuprofen (ADVIL,MOTRIN) 200 MG tablet Take 200 mg by mouth 2 (two) times daily.    Marland Kitchen loratadine (CLARITIN) 10 MG tablet Take 10 mg by mouth daily.    Marland Kitchen omeprazole (PRILOSEC) 40 MG capsule Take 40 mg by mouth daily.  4   No current facility-administered medications for this visit.     Medication Side Effects: None  Allergies: No Known Allergies  Past Medical History:  Diagnosis Date  . ADHD (attention deficit hyperactivity disorder)   . Depression   . Fibromyalgia   . GERD (gastroesophageal reflux disease)   . Hyperlipidemia   . Hypertension   . Long term (current) use of opiate analgesic   . Metabolic syndrome   . Migraines   . Obesity   . Sleep apnea     Family History  Problem Relation Age of  Onset  . Hypertension Brother   . CAD Brother     Social History   Socioeconomic History  . Marital status: Married    Spouse name: Not on file  . Number of children: Not on file  . Years of education: Not on file  . Highest education level: Not on file  Occupational History  . Not on file  Social Needs  . Financial resource strain: Not on file  . Food insecurity:    Worry: Not on file    Inability: Not on file  . Transportation needs:    Medical: Not on file    Non-medical: Not on file  Tobacco Use  . Smoking status: Former Smoker    Types: Cigarettes    Last attempt  to quit: 06/18/2001    Years since quitting: 16.8  . Smokeless tobacco: Never Used  Substance and Sexual Activity  . Alcohol use: Yes    Comment: occasional  . Drug use: Not on file  . Sexual activity: Not on file  Lifestyle  . Physical activity:    Days per week: Not on file    Minutes per session: Not on file  . Stress: Not on file  Relationships  . Social connections:    Talks on phone: Not on file    Gets together: Not on file    Attends religious service: Not on file    Active member of club or organization: Not on file    Attends meetings of clubs or organizations: Not on file    Relationship status: Not on file  . Intimate partner violence:    Fear of current or ex partner: Not on file    Emotionally abused: Not on file    Physically abused: Not on file    Forced sexual activity: Not on file  Other Topics Concern  . Not on file  Social History Narrative  . Not on file    Past Medical History, Surgical history, Social history, and Family history were reviewed and updated as appropriate.   Recent normal card stress test and colonoscopy.  Please see review of systems for further details on the patient's review from today.   Objective:   Physical Exam:  LMP 04/18/2017 (Approximate)   Physical Exam  Constitutional: She is oriented to person, place, and time. She appears well-developed. No distress.  Musculoskeletal: She exhibits no deformity.  Neurological: She is alert and oriented to person, place, and time. She displays no tremor. Coordination and gait normal.  Psychiatric: She has a normal mood and affect. Her speech is normal and behavior is normal. Judgment and thought content normal. Her mood appears not anxious. Her affect is not angry, not blunt, not labile and not inappropriate. Cognition and memory are normal. She does not exhibit a depressed mood. She expresses no homicidal and no suicidal ideation. She expresses no suicidal plans and no homicidal plans.   Insight intact. No auditory or visual hallucinations. No delusions.  Overall mood much improved this year over last year.    Lab Review:     Component Value Date/Time   BUN 11 10/04/2010 1620   CREATININE 0.85 10/04/2010 1620   GFRNONAA >60 10/04/2010 1620   GFRAA  10/04/2010 1620    >60        The eGFR has been calculated using the MDRD equation. This calculation has not been validated in all clinical situations. eGFR's persistently <60 mL/min signify possible Chronic Kidney Disease.    No results found  for: WBC, RBC, HGB, HCT, PLT, MCV, MCH, MCHC, RDW, LYMPHSABS, MONOABS, EOSABS, BASOSABS  No results found for: POCLITH, LITHIUM   No results found for: PHENYTOIN, PHENOBARB, VALPROATE, CBMZ   .res Assessment: Plan:    Major depressive disorder, recurrent episode, moderate (HCC)  Attention deficit hyperactivity disorder (ADHD), combined type  Panic disorder with agoraphobia  Greater than 50% of face to face time with patient was spent on counseling and coordination of care. We discussed hx TRD.    Disc the risks involved using uppers and downers together.  Also sz risk with the combo of wellbutrin and stimulants.  She seems to be benefitting and tolerating.    Disc progressing in dealing with driving phobias.  20 min appt.  FU prn.  Talk with PCP about prescribing her meds since she seems stable  .Lynder Parents, Md, DFAPA   No future appointments.  No orders of the defined types were placed in this encounter.     -------------------------------

## 2018-05-07 DIAGNOSIS — Z23 Encounter for immunization: Secondary | ICD-10-CM | POA: Diagnosis not present

## 2018-05-07 DIAGNOSIS — Z1389 Encounter for screening for other disorder: Secondary | ICD-10-CM | POA: Diagnosis not present

## 2018-05-07 DIAGNOSIS — I10 Essential (primary) hypertension: Secondary | ICD-10-CM | POA: Diagnosis not present

## 2018-05-07 DIAGNOSIS — Z6834 Body mass index (BMI) 34.0-34.9, adult: Secondary | ICD-10-CM | POA: Diagnosis not present

## 2018-05-07 DIAGNOSIS — E782 Mixed hyperlipidemia: Secondary | ICD-10-CM | POA: Diagnosis not present

## 2018-05-07 DIAGNOSIS — F900 Attention-deficit hyperactivity disorder, predominantly inattentive type: Secondary | ICD-10-CM | POA: Diagnosis not present

## 2018-05-07 DIAGNOSIS — Z79891 Long term (current) use of opiate analgesic: Secondary | ICD-10-CM | POA: Diagnosis not present

## 2018-05-07 DIAGNOSIS — E8881 Metabolic syndrome: Secondary | ICD-10-CM | POA: Diagnosis not present

## 2018-08-15 DIAGNOSIS — Z79891 Long term (current) use of opiate analgesic: Secondary | ICD-10-CM | POA: Diagnosis not present

## 2018-08-15 DIAGNOSIS — I10 Essential (primary) hypertension: Secondary | ICD-10-CM | POA: Diagnosis not present

## 2018-08-15 DIAGNOSIS — Z6835 Body mass index (BMI) 35.0-35.9, adult: Secondary | ICD-10-CM | POA: Diagnosis not present

## 2018-08-15 DIAGNOSIS — E8881 Metabolic syndrome: Secondary | ICD-10-CM | POA: Diagnosis not present

## 2018-08-15 DIAGNOSIS — M797 Fibromyalgia: Secondary | ICD-10-CM | POA: Diagnosis not present

## 2018-08-15 DIAGNOSIS — Z0001 Encounter for general adult medical examination with abnormal findings: Secondary | ICD-10-CM | POA: Diagnosis not present

## 2018-08-15 DIAGNOSIS — E782 Mixed hyperlipidemia: Secondary | ICD-10-CM | POA: Diagnosis not present

## 2018-08-15 DIAGNOSIS — K210 Gastro-esophageal reflux disease with esophagitis: Secondary | ICD-10-CM | POA: Diagnosis not present

## 2018-08-19 DIAGNOSIS — F900 Attention-deficit hyperactivity disorder, predominantly inattentive type: Secondary | ICD-10-CM | POA: Diagnosis not present

## 2018-08-19 DIAGNOSIS — Z0001 Encounter for general adult medical examination with abnormal findings: Secondary | ICD-10-CM | POA: Diagnosis not present

## 2018-08-19 DIAGNOSIS — E782 Mixed hyperlipidemia: Secondary | ICD-10-CM | POA: Diagnosis not present

## 2018-08-19 DIAGNOSIS — F331 Major depressive disorder, recurrent, moderate: Secondary | ICD-10-CM | POA: Diagnosis not present

## 2018-08-19 DIAGNOSIS — I10 Essential (primary) hypertension: Secondary | ICD-10-CM | POA: Diagnosis not present

## 2018-08-19 DIAGNOSIS — G4733 Obstructive sleep apnea (adult) (pediatric): Secondary | ICD-10-CM | POA: Diagnosis not present

## 2018-08-19 DIAGNOSIS — Z79891 Long term (current) use of opiate analgesic: Secondary | ICD-10-CM | POA: Diagnosis not present

## 2018-08-19 DIAGNOSIS — K219 Gastro-esophageal reflux disease without esophagitis: Secondary | ICD-10-CM | POA: Diagnosis not present

## 2018-08-19 DIAGNOSIS — G252 Other specified forms of tremor: Secondary | ICD-10-CM | POA: Diagnosis not present

## 2018-08-19 DIAGNOSIS — M797 Fibromyalgia: Secondary | ICD-10-CM | POA: Diagnosis not present

## 2018-08-19 DIAGNOSIS — Z6835 Body mass index (BMI) 35.0-35.9, adult: Secondary | ICD-10-CM | POA: Diagnosis not present

## 2018-08-19 DIAGNOSIS — E8881 Metabolic syndrome: Secondary | ICD-10-CM | POA: Diagnosis not present

## 2018-08-19 DIAGNOSIS — M545 Low back pain: Secondary | ICD-10-CM | POA: Diagnosis not present

## 2018-10-07 DIAGNOSIS — R7301 Impaired fasting glucose: Secondary | ICD-10-CM | POA: Diagnosis not present

## 2018-11-01 DIAGNOSIS — D179 Benign lipomatous neoplasm, unspecified: Secondary | ICD-10-CM | POA: Diagnosis not present

## 2018-11-01 DIAGNOSIS — Z6836 Body mass index (BMI) 36.0-36.9, adult: Secondary | ICD-10-CM | POA: Diagnosis not present

## 2018-11-14 DIAGNOSIS — K5732 Diverticulitis of large intestine without perforation or abscess without bleeding: Secondary | ICD-10-CM | POA: Diagnosis not present

## 2018-11-14 DIAGNOSIS — E8881 Metabolic syndrome: Secondary | ICD-10-CM | POA: Diagnosis not present

## 2018-11-14 DIAGNOSIS — E782 Mixed hyperlipidemia: Secondary | ICD-10-CM | POA: Diagnosis not present

## 2018-11-14 DIAGNOSIS — G4733 Obstructive sleep apnea (adult) (pediatric): Secondary | ICD-10-CM | POA: Diagnosis not present

## 2018-11-14 DIAGNOSIS — I10 Essential (primary) hypertension: Secondary | ICD-10-CM | POA: Diagnosis not present

## 2018-11-14 DIAGNOSIS — R7301 Impaired fasting glucose: Secondary | ICD-10-CM | POA: Diagnosis not present

## 2018-11-14 DIAGNOSIS — E039 Hypothyroidism, unspecified: Secondary | ICD-10-CM | POA: Diagnosis not present

## 2018-11-14 DIAGNOSIS — K210 Gastro-esophageal reflux disease with esophagitis: Secondary | ICD-10-CM | POA: Diagnosis not present

## 2018-11-18 DIAGNOSIS — Z6836 Body mass index (BMI) 36.0-36.9, adult: Secondary | ICD-10-CM | POA: Diagnosis not present

## 2018-11-18 DIAGNOSIS — F331 Major depressive disorder, recurrent, moderate: Secondary | ICD-10-CM | POA: Diagnosis not present

## 2018-11-18 DIAGNOSIS — F900 Attention-deficit hyperactivity disorder, predominantly inattentive type: Secondary | ICD-10-CM | POA: Diagnosis not present

## 2018-11-18 DIAGNOSIS — E782 Mixed hyperlipidemia: Secondary | ICD-10-CM | POA: Diagnosis not present

## 2018-11-18 DIAGNOSIS — G252 Other specified forms of tremor: Secondary | ICD-10-CM | POA: Diagnosis not present

## 2018-11-18 DIAGNOSIS — Z79891 Long term (current) use of opiate analgesic: Secondary | ICD-10-CM | POA: Diagnosis not present

## 2018-11-18 DIAGNOSIS — E8881 Metabolic syndrome: Secondary | ICD-10-CM | POA: Diagnosis not present

## 2018-11-18 DIAGNOSIS — I10 Essential (primary) hypertension: Secondary | ICD-10-CM | POA: Diagnosis not present

## 2018-12-01 DIAGNOSIS — Z79891 Long term (current) use of opiate analgesic: Secondary | ICD-10-CM | POA: Diagnosis not present

## 2018-12-01 DIAGNOSIS — F900 Attention-deficit hyperactivity disorder, predominantly inattentive type: Secondary | ICD-10-CM | POA: Diagnosis not present

## 2018-12-01 DIAGNOSIS — E782 Mixed hyperlipidemia: Secondary | ICD-10-CM | POA: Diagnosis not present

## 2018-12-01 DIAGNOSIS — Z6837 Body mass index (BMI) 37.0-37.9, adult: Secondary | ICD-10-CM | POA: Diagnosis not present

## 2018-12-01 DIAGNOSIS — R7301 Impaired fasting glucose: Secondary | ICD-10-CM | POA: Diagnosis not present

## 2018-12-01 DIAGNOSIS — E8881 Metabolic syndrome: Secondary | ICD-10-CM | POA: Diagnosis not present

## 2018-12-01 DIAGNOSIS — K219 Gastro-esophageal reflux disease without esophagitis: Secondary | ICD-10-CM | POA: Diagnosis not present

## 2018-12-01 DIAGNOSIS — I10 Essential (primary) hypertension: Secondary | ICD-10-CM | POA: Diagnosis not present

## 2018-12-12 DIAGNOSIS — H6092 Unspecified otitis externa, left ear: Secondary | ICD-10-CM | POA: Diagnosis not present

## 2018-12-12 DIAGNOSIS — Z6837 Body mass index (BMI) 37.0-37.9, adult: Secondary | ICD-10-CM | POA: Diagnosis not present

## 2018-12-29 DIAGNOSIS — D179 Benign lipomatous neoplasm, unspecified: Secondary | ICD-10-CM | POA: Diagnosis not present

## 2019-01-01 DIAGNOSIS — D485 Neoplasm of uncertain behavior of skin: Secondary | ICD-10-CM | POA: Diagnosis not present

## 2019-01-01 DIAGNOSIS — D1722 Benign lipomatous neoplasm of skin and subcutaneous tissue of left arm: Secondary | ICD-10-CM | POA: Diagnosis not present

## 2019-01-02 DIAGNOSIS — Z1231 Encounter for screening mammogram for malignant neoplasm of breast: Secondary | ICD-10-CM | POA: Diagnosis not present

## 2019-01-12 DIAGNOSIS — S40022A Contusion of left upper arm, initial encounter: Secondary | ICD-10-CM | POA: Diagnosis not present

## 2019-01-20 DIAGNOSIS — L03112 Cellulitis of left axilla: Secondary | ICD-10-CM | POA: Diagnosis not present

## 2019-02-10 DIAGNOSIS — L03114 Cellulitis of left upper limb: Secondary | ICD-10-CM | POA: Diagnosis not present

## 2019-02-10 DIAGNOSIS — M545 Low back pain: Secondary | ICD-10-CM | POA: Diagnosis not present

## 2019-02-10 DIAGNOSIS — Z6836 Body mass index (BMI) 36.0-36.9, adult: Secondary | ICD-10-CM | POA: Diagnosis not present

## 2019-02-25 DIAGNOSIS — L570 Actinic keratosis: Secondary | ICD-10-CM | POA: Diagnosis not present

## 2019-02-27 DIAGNOSIS — I10 Essential (primary) hypertension: Secondary | ICD-10-CM | POA: Diagnosis not present

## 2019-02-27 DIAGNOSIS — K210 Gastro-esophageal reflux disease with esophagitis: Secondary | ICD-10-CM | POA: Diagnosis not present

## 2019-02-27 DIAGNOSIS — M797 Fibromyalgia: Secondary | ICD-10-CM | POA: Diagnosis not present

## 2019-02-27 DIAGNOSIS — E8881 Metabolic syndrome: Secondary | ICD-10-CM | POA: Diagnosis not present

## 2019-02-27 DIAGNOSIS — E782 Mixed hyperlipidemia: Secondary | ICD-10-CM | POA: Diagnosis not present

## 2019-03-03 DIAGNOSIS — F331 Major depressive disorder, recurrent, moderate: Secondary | ICD-10-CM | POA: Diagnosis not present

## 2019-03-03 DIAGNOSIS — Z6836 Body mass index (BMI) 36.0-36.9, adult: Secondary | ICD-10-CM | POA: Diagnosis not present

## 2019-03-03 DIAGNOSIS — G252 Other specified forms of tremor: Secondary | ICD-10-CM | POA: Diagnosis not present

## 2019-03-03 DIAGNOSIS — M545 Low back pain: Secondary | ICD-10-CM | POA: Diagnosis not present

## 2019-03-03 DIAGNOSIS — I10 Essential (primary) hypertension: Secondary | ICD-10-CM | POA: Diagnosis not present

## 2019-03-03 DIAGNOSIS — Z9189 Other specified personal risk factors, not elsewhere classified: Secondary | ICD-10-CM | POA: Diagnosis not present

## 2019-03-03 DIAGNOSIS — Z23 Encounter for immunization: Secondary | ICD-10-CM | POA: Diagnosis not present

## 2019-03-03 DIAGNOSIS — Z79891 Long term (current) use of opiate analgesic: Secondary | ICD-10-CM | POA: Diagnosis not present

## 2019-03-03 DIAGNOSIS — K219 Gastro-esophageal reflux disease without esophagitis: Secondary | ICD-10-CM | POA: Diagnosis not present

## 2019-03-03 DIAGNOSIS — M797 Fibromyalgia: Secondary | ICD-10-CM | POA: Diagnosis not present

## 2019-03-03 DIAGNOSIS — G4733 Obstructive sleep apnea (adult) (pediatric): Secondary | ICD-10-CM | POA: Diagnosis not present

## 2019-03-03 DIAGNOSIS — E8881 Metabolic syndrome: Secondary | ICD-10-CM | POA: Diagnosis not present

## 2019-03-03 DIAGNOSIS — F900 Attention-deficit hyperactivity disorder, predominantly inattentive type: Secondary | ICD-10-CM | POA: Diagnosis not present

## 2019-03-03 DIAGNOSIS — E782 Mixed hyperlipidemia: Secondary | ICD-10-CM | POA: Diagnosis not present

## 2019-03-09 ENCOUNTER — Ambulatory Visit (INDEPENDENT_AMBULATORY_CARE_PROVIDER_SITE_OTHER): Payer: Self-pay | Admitting: Bariatrics

## 2019-03-13 ENCOUNTER — Telehealth: Payer: Self-pay | Admitting: Psychiatry

## 2019-03-13 NOTE — Telephone Encounter (Signed)
Patient called and wanted to know recommendations for a light box. She wants to purchase one. Please give her a call at [phone]

## 2019-03-16 NOTE — Telephone Encounter (Signed)
Send her a copy of the light box handout that I have.

## 2019-03-18 DIAGNOSIS — F331 Major depressive disorder, recurrent, moderate: Secondary | ICD-10-CM | POA: Diagnosis not present

## 2019-03-18 DIAGNOSIS — I10 Essential (primary) hypertension: Secondary | ICD-10-CM | POA: Diagnosis not present

## 2019-03-19 NOTE — Telephone Encounter (Signed)
I provided a copy of the handout to the staff to send to the patient.  This was on light therapy.

## 2019-03-23 ENCOUNTER — Ambulatory Visit (INDEPENDENT_AMBULATORY_CARE_PROVIDER_SITE_OTHER): Payer: Self-pay | Admitting: Bariatrics

## 2019-04-01 DIAGNOSIS — Z6836 Body mass index (BMI) 36.0-36.9, adult: Secondary | ICD-10-CM | POA: Diagnosis not present

## 2019-04-01 DIAGNOSIS — N3090 Cystitis, unspecified without hematuria: Secondary | ICD-10-CM | POA: Diagnosis not present

## 2019-06-01 DIAGNOSIS — E782 Mixed hyperlipidemia: Secondary | ICD-10-CM | POA: Diagnosis not present

## 2019-06-01 DIAGNOSIS — R7301 Impaired fasting glucose: Secondary | ICD-10-CM | POA: Diagnosis not present

## 2019-06-01 DIAGNOSIS — Z6837 Body mass index (BMI) 37.0-37.9, adult: Secondary | ICD-10-CM | POA: Diagnosis not present

## 2019-06-01 DIAGNOSIS — E8881 Metabolic syndrome: Secondary | ICD-10-CM | POA: Diagnosis not present

## 2019-06-01 DIAGNOSIS — F331 Major depressive disorder, recurrent, moderate: Secondary | ICD-10-CM | POA: Diagnosis not present

## 2019-06-01 DIAGNOSIS — F900 Attention-deficit hyperactivity disorder, predominantly inattentive type: Secondary | ICD-10-CM | POA: Diagnosis not present

## 2019-06-01 DIAGNOSIS — G252 Other specified forms of tremor: Secondary | ICD-10-CM | POA: Diagnosis not present

## 2019-06-01 DIAGNOSIS — I10 Essential (primary) hypertension: Secondary | ICD-10-CM | POA: Diagnosis not present

## 2019-07-17 DIAGNOSIS — I10 Essential (primary) hypertension: Secondary | ICD-10-CM | POA: Diagnosis not present

## 2019-07-17 DIAGNOSIS — E7849 Other hyperlipidemia: Secondary | ICD-10-CM | POA: Diagnosis not present

## 2019-07-28 DIAGNOSIS — I10 Essential (primary) hypertension: Secondary | ICD-10-CM | POA: Diagnosis not present

## 2019-07-28 DIAGNOSIS — D179 Benign lipomatous neoplasm, unspecified: Secondary | ICD-10-CM | POA: Diagnosis not present

## 2019-08-14 DIAGNOSIS — I10 Essential (primary) hypertension: Secondary | ICD-10-CM | POA: Diagnosis not present

## 2019-08-14 DIAGNOSIS — E7849 Other hyperlipidemia: Secondary | ICD-10-CM | POA: Diagnosis not present

## 2019-08-28 ENCOUNTER — Encounter: Payer: Self-pay | Admitting: "Endocrinology

## 2019-08-28 DIAGNOSIS — Z0001 Encounter for general adult medical examination with abnormal findings: Secondary | ICD-10-CM | POA: Diagnosis not present

## 2019-09-02 DIAGNOSIS — Z23 Encounter for immunization: Secondary | ICD-10-CM | POA: Diagnosis not present

## 2019-09-02 DIAGNOSIS — Z79891 Long term (current) use of opiate analgesic: Secondary | ICD-10-CM | POA: Diagnosis not present

## 2019-09-02 DIAGNOSIS — R4582 Worries: Secondary | ICD-10-CM | POA: Diagnosis not present

## 2019-09-02 DIAGNOSIS — E8881 Metabolic syndrome: Secondary | ICD-10-CM | POA: Diagnosis not present

## 2019-09-02 DIAGNOSIS — G4733 Obstructive sleep apnea (adult) (pediatric): Secondary | ICD-10-CM | POA: Diagnosis not present

## 2019-09-02 DIAGNOSIS — F331 Major depressive disorder, recurrent, moderate: Secondary | ICD-10-CM | POA: Diagnosis not present

## 2019-09-02 DIAGNOSIS — Z9189 Other specified personal risk factors, not elsewhere classified: Secondary | ICD-10-CM | POA: Diagnosis not present

## 2019-09-02 DIAGNOSIS — Z0001 Encounter for general adult medical examination with abnormal findings: Secondary | ICD-10-CM | POA: Diagnosis not present

## 2019-09-02 DIAGNOSIS — E782 Mixed hyperlipidemia: Secondary | ICD-10-CM | POA: Diagnosis not present

## 2019-09-02 DIAGNOSIS — I10 Essential (primary) hypertension: Secondary | ICD-10-CM | POA: Diagnosis not present

## 2019-09-02 DIAGNOSIS — R7301 Impaired fasting glucose: Secondary | ICD-10-CM | POA: Diagnosis not present

## 2019-09-02 DIAGNOSIS — Z6836 Body mass index (BMI) 36.0-36.9, adult: Secondary | ICD-10-CM | POA: Diagnosis not present

## 2019-09-02 DIAGNOSIS — F900 Attention-deficit hyperactivity disorder, predominantly inattentive type: Secondary | ICD-10-CM | POA: Diagnosis not present

## 2019-09-02 DIAGNOSIS — M797 Fibromyalgia: Secondary | ICD-10-CM | POA: Diagnosis not present

## 2019-09-02 DIAGNOSIS — K219 Gastro-esophageal reflux disease without esophagitis: Secondary | ICD-10-CM | POA: Diagnosis not present

## 2019-09-16 DIAGNOSIS — E7849 Other hyperlipidemia: Secondary | ICD-10-CM | POA: Diagnosis not present

## 2019-09-16 DIAGNOSIS — I10 Essential (primary) hypertension: Secondary | ICD-10-CM | POA: Diagnosis not present

## 2019-10-06 ENCOUNTER — Other Ambulatory Visit: Payer: Self-pay

## 2019-10-06 ENCOUNTER — Encounter: Payer: Self-pay | Admitting: "Endocrinology

## 2019-10-06 ENCOUNTER — Ambulatory Visit: Payer: Medicare PPO | Admitting: "Endocrinology

## 2019-10-06 VITALS — BP 129/82 | HR 76 | Ht 65.0 in | Wt 226.4 lb

## 2019-10-06 DIAGNOSIS — E8881 Metabolic syndrome: Secondary | ICD-10-CM

## 2019-10-06 DIAGNOSIS — E782 Mixed hyperlipidemia: Secondary | ICD-10-CM | POA: Diagnosis not present

## 2019-10-06 DIAGNOSIS — L853 Xerosis cutis: Secondary | ICD-10-CM | POA: Diagnosis not present

## 2019-10-06 DIAGNOSIS — I10 Essential (primary) hypertension: Secondary | ICD-10-CM | POA: Diagnosis not present

## 2019-10-06 DIAGNOSIS — Z6837 Body mass index (BMI) 37.0-37.9, adult: Secondary | ICD-10-CM | POA: Diagnosis not present

## 2019-10-06 LAB — POCT GLYCOSYLATED HEMOGLOBIN (HGB A1C): Hemoglobin A1C: 5.4 % (ref 4.0–5.6)

## 2019-10-06 NOTE — Progress Notes (Signed)
Endocrinology Consult Note                                            10/07/2019, 8:06 AM   Subjective:    Patient ID: Melanie Case, female    DOB: 1966-02-13, PCP Caryl Bis, MD   Past Medical History:  Diagnosis Date  . ADHD (attention deficit hyperactivity disorder)   . Depression   . Fibromyalgia   . GERD (gastroesophageal reflux disease)   . Hyperlipidemia   . Hypertension   . Long term (current) use of opiate analgesic   . Metabolic syndrome   . Migraines   . Obesity   . Sleep apnea    Past Surgical History:  Procedure Laterality Date  . Left MCA aneurysm coiling  2003   Social History   Socioeconomic History  . Marital status: Married    Spouse name: Not on file  . Number of children: Not on file  . Years of education: Not on file  . Highest education level: Not on file  Occupational History  . Not on file  Tobacco Use  . Smoking status: Former Smoker    Types: Cigarettes    Quit date: 06/18/2001    Years since quitting: 18.3  . Smokeless tobacco: Never Used  Substance and Sexual Activity  . Alcohol use: Yes    Comment: occasional  . Drug use: Never  . Sexual activity: Not on file  Other Topics Concern  . Not on file  Social History Narrative  . Not on file   Social Determinants of Health   Financial Resource Strain:   . Difficulty of Paying Living Expenses:   Food Insecurity:   . Worried About Charity fundraiser in the Last Year:   . Arboriculturist in the Last Year:   Transportation Needs:   . Film/video editor (Medical):   Marland Kitchen Lack of Transportation (Non-Medical):   Physical Activity:   . Days of Exercise per Week:   . Minutes of Exercise per Session:   Stress:   . Feeling of Stress :   Social Connections:   . Frequency of Communication with Friends and Family:   . Frequency of Social Gatherings with Friends and Family:   . Attends Religious Services:   . Active Member of Clubs or Organizations:   . Attends English as a second language teacher Meetings:   Marland Kitchen Marital Status:    Family History  Problem Relation Age of Onset  . Hypertension Brother   . CAD Brother   . Heart attack Mother   . Kidney disease Mother    Outpatient Encounter Medications as of 10/06/2019  Medication Sig  . amphetamine-dextroamphetamine (ADDERALL) 20 MG tablet Take 20 mg by mouth 2 (two) times daily.  Marland Kitchen atenolol (TENORMIN) 50 MG tablet Take 50 mg by mouth daily.  Marland Kitchen buPROPion (WELLBUTRIN XL) 150 MG 24 hr tablet TAKE 3 TABLETS BY MOUTH IN THE MORNING  . clonazePAM (KLONOPIN) 0.5 MG tablet Take 0.5 mg by mouth 3 (three) times daily as needed for anxiety.  Marland Kitchen desvenlafaxine (PRISTIQ) 100 MG 24 hr tablet TAKE 1 TABLET BY MOUTH ONCE DAILY  . HYDROcodone-acetaminophen (NORCO) 7.5-325 MG tablet Take 1 tablet by mouth every 6 (six) hours as needed for moderate pain.  Marland Kitchen ibuprofen (ADVIL,MOTRIN) 200 MG tablet Take 200 mg by mouth  2 (two) times daily.  Marland Kitchen loratadine (CLARITIN) 10 MG tablet Take 10 mg by mouth daily.  Marland Kitchen losartan (COZAAR) 25 MG tablet 1 tablet daily.  Marland Kitchen omeprazole (PRILOSEC) 40 MG capsule Take 40 mg by mouth daily.  . simvastatin (ZOCOR) 40 MG tablet Take 40 mg by mouth daily.  Marland Kitchen topiramate (TOPAMAX) 100 MG tablet Take 100 mg by mouth 2 (two) times daily.  Marland Kitchen triamcinolone cream (KENALOG) 0.1 % Apply 1 application topically 3 (three) times daily.   No facility-administered encounter medications on file as of 10/06/2019.   ALLERGIES: No Known Allergies  VACCINATION STATUS:  There is no immunization history on file for this patient.  HPI Melanie Case is 54 y.o. female who presents today with a medical history as above. she is being seen in consultation for metabolic syndrome requested by Caryl Bis, MD.  History is obtained directly from the patient.  She provides history consistent with metabolic syndrome for which she was initiated on Metformin currently 500 mg p.o. twice daily.  Her point-of-care A1c today 5.4%.  She has  multiple complaints including progressive weight gain, vaginal dryness, skin dryness, fatigue, low libido. Her dietary habit is heavy, consumption of sweetened beverages.  She has hypertension and hyperlipidemia on treatment with losartan and simvastatin respectively.  She has 1 grown child, denies any history of gestational diabetes.  She denies any prior history of thyroid dysfunction.  She is currently on topiramate 100 mg p.o. daily for migraine headaches.  She is on various antidepressants and mood stabilizers.  She complains of point of hyperhidrosis.  Review of Systems  Constitutional: + Progressive weight gain, + fatigue, + subjective hyperthermia,  Eyes: no blurry vision, no xerophthalmia ENT: no sore throat, no nodules palpated in throat, no dysphagia/odynophagia, no hoarseness Cardiovascular: no Chest Pain, no Shortness of Breath, no palpitations, no leg swelling Respiratory: no cough, no shortness of breath Gastrointestinal: no Nausea/Vomiting/Diarhhea Musculoskeletal: no muscle/joint aches Skin: no rashes Neurological: no tremors, no numbness, no tingling, no dizziness Psychiatric: no depression, no anxiety  Objective:    Vitals with BMI 10/06/2019 12/20/2017 11/14/2017  Height 5' 5"  5' 5"  5' 5"   Weight 226 lbs 6 oz 216 lbs 216 lbs  BMI 37.67 03.50 09.38  Systolic 182 993 716  Diastolic 82 80 70  Pulse 76 73 80    BP 129/82   Pulse 76   Ht 5' 5"  (1.651 m)   Wt 226 lb 6.4 oz (102.7 kg)   LMP 04/18/2017 (Approximate)   BMI 37.67 kg/m   Wt Readings from Last 3 Encounters:  10/06/19 226 lb 6.4 oz (102.7 kg)  12/20/17 216 lb (98 kg)  11/14/17 216 lb (98 kg)    Physical Exam  Constitutional:  Body mass index is 37.67 kg/m.,  not in acute distress, normal state of mind Eyes: PERRLA, EOMI, no exophthalmos ENT: moist mucous membranes, no gross thyromegaly, no gross cervical lymphadenopathy, + mild dorsal cervical fat pad.+ Supraclavicular fullness. Cardiovascular:  normal precordial activity, Regular Rate and Rhythm, no Murmur/Rubs/Gallops Respiratory:  adequate breathing efforts, no gross chest deformity, Clear to auscultation bilaterally Gastrointestinal: abdomen soft, Non -tender, No distension, Bowel Sounds present, no gross organomegaly Musculoskeletal: no gross deformities, strength intact in all four extremities Skin:  warm, no rashes, + dry scaly skin on lower extremity Neurological: no tremor with outstretched hands, Deep tendon reflexes normal in bilateral lower extremities.  CMP ( most recent) CMP     Component Value Date/Time   BUN 11  10/04/2010 1620   CREATININE 0.85 10/04/2010 1620   GFRNONAA >60 10/04/2010 1620   GFRAA  10/04/2010 1620    >60        The eGFR has been calculated using the MDRD equation. This calculation has not been validated in all clinical situations. eGFR's persistently <60 mL/min signify possible Chronic Kidney Disease.     Diabetic Labs (most recent): Lab Results  Component Value Date   HGBA1C 5.4 10/06/2019     Assessment & Plan:   1. Metabolic syndrome  - Laurita P Pauli  is being seen at a kind request of Caryl Bis, MD. - I have reviewed her available endocrine records and clinically evaluated the patient. - Based on these reviews, she has metabolic syndrome for which she will benefit the most from weight loss.  - she  admits there is a room for improvement in her diet and drink choices. -  Suggestion is made for her to avoid simple carbohydrates  from her diet including Cakes, Sweet Desserts / Pastries, Ice Cream, Soda (diet and regular), Sweet Tea, Candies, Chips, Cookies, Sweet Pastries,  Store Bought Juices, Alcohol in Excess of  1-2 drinks a day, Artificial Sweeteners, Coffee Creamer, and "Sugar-free" Products. This will help  avoid unintended weight gain.  She does not have diabetes, A1c at point-of-care is 5.4%, however she will continue to benefit from metformin intervention.   She is advised to continue Metformin 500 mg p.o. twice daily.  Regarding her complaint of intermittent hyperhidrosis, she will be worked up for pheochromocytoma with 24-hour urine catecholamines, metanephrines as well as plasma metanephrines.  This could be a result of her perimenopausal estrogen deficiency evidenced by her complaint of vaginal dryness.  She is advised to seek evaluation by OB/GYN.  -Her pretest probability for pheochromocytoma is low. -She does not have signs of Cushing syndrome, however her major concern is the weight gain (although this is likely due to her consumption of large quantities of sweetened beverages), she is offered evaluation for Cushing's syndrome and measuring 24-hour urine free cortisol.  -She will also have thyroid function tests and return for office visit in 10 days. -She is advised to continue her medications including simvastatin, and losartan. -Regarding her severe, bilateral skin dryness, she is advised to use moisturizers including Vaseline.  She may need evaluation by dermatology for possible biopsy of one of the scaly lesions.   - I did not initiate any new prescriptions today. - she is advised to maintain close follow up with Caryl Bis, MD for primary care needs.   - Time spent with the patient: 60 minutes, of which >50% was spent in  counseling her about her metabolic syndrome, hypertension, hyperlipidemia, obesity and the rest in obtaining information about her symptoms, reviewing her previous labs/studies ( including abstractions from other facilities),  evaluations, and treatments,  and developing a plan to confirm diagnosis and long term treatment based on the latest standards of care/guidelines; and documenting her care.  Tamicka P Corkum participated in the discussions, expressed understanding, and voiced agreement with the above plans.  All questions were answered to her satisfaction. she is encouraged to contact clinic should she have  any questions or concerns prior to her return visit.  Follow up plan: Return in about 2 weeks (around 10/20/2019) for 24 Hour Urine Free Cortisol and Creatinine, Follow up with Pre-visit Labs.   Glade Lloyd, MD [facility] [address] [place], [place] [address] Phone:  [phone]  Fax: [phone]     10/07/2019, 8:06 AM  This note was partially dictated with voice recognition software. Similar sounding words can be transcribed inadequately or may not  be corrected upon review.

## 2019-10-06 NOTE — Patient Instructions (Signed)

## 2019-10-07 ENCOUNTER — Encounter: Payer: Self-pay | Admitting: "Endocrinology

## 2019-10-07 DIAGNOSIS — E782 Mixed hyperlipidemia: Secondary | ICD-10-CM | POA: Insufficient documentation

## 2019-10-07 DIAGNOSIS — L853 Xerosis cutis: Secondary | ICD-10-CM | POA: Insufficient documentation

## 2019-10-07 DIAGNOSIS — I10 Essential (primary) hypertension: Secondary | ICD-10-CM | POA: Insufficient documentation

## 2019-10-07 DIAGNOSIS — E8881 Metabolic syndrome: Secondary | ICD-10-CM | POA: Insufficient documentation

## 2019-10-07 DIAGNOSIS — Z6837 Body mass index (BMI) 37.0-37.9, adult: Secondary | ICD-10-CM | POA: Insufficient documentation

## 2019-10-16 DIAGNOSIS — I10 Essential (primary) hypertension: Secondary | ICD-10-CM | POA: Diagnosis not present

## 2019-10-16 DIAGNOSIS — K219 Gastro-esophageal reflux disease without esophagitis: Secondary | ICD-10-CM | POA: Diagnosis not present

## 2019-10-23 ENCOUNTER — Ambulatory Visit: Payer: Medicare PPO | Admitting: "Endocrinology

## 2019-12-03 DIAGNOSIS — M797 Fibromyalgia: Secondary | ICD-10-CM | POA: Diagnosis not present

## 2019-12-03 DIAGNOSIS — I10 Essential (primary) hypertension: Secondary | ICD-10-CM | POA: Diagnosis not present

## 2019-12-03 DIAGNOSIS — K219 Gastro-esophageal reflux disease without esophagitis: Secondary | ICD-10-CM | POA: Diagnosis not present

## 2019-12-03 DIAGNOSIS — R7301 Impaired fasting glucose: Secondary | ICD-10-CM | POA: Diagnosis not present

## 2019-12-03 DIAGNOSIS — E8881 Metabolic syndrome: Secondary | ICD-10-CM | POA: Diagnosis not present

## 2019-12-03 DIAGNOSIS — E782 Mixed hyperlipidemia: Secondary | ICD-10-CM | POA: Diagnosis not present

## 2019-12-07 DIAGNOSIS — F900 Attention-deficit hyperactivity disorder, predominantly inattentive type: Secondary | ICD-10-CM | POA: Diagnosis not present

## 2019-12-07 DIAGNOSIS — Z79891 Long term (current) use of opiate analgesic: Secondary | ICD-10-CM | POA: Diagnosis not present

## 2019-12-07 DIAGNOSIS — G4733 Obstructive sleep apnea (adult) (pediatric): Secondary | ICD-10-CM | POA: Diagnosis not present

## 2019-12-07 DIAGNOSIS — F331 Major depressive disorder, recurrent, moderate: Secondary | ICD-10-CM | POA: Diagnosis not present

## 2019-12-07 DIAGNOSIS — E8881 Metabolic syndrome: Secondary | ICD-10-CM | POA: Diagnosis not present

## 2019-12-07 DIAGNOSIS — I10 Essential (primary) hypertension: Secondary | ICD-10-CM | POA: Diagnosis not present

## 2019-12-07 DIAGNOSIS — Z0001 Encounter for general adult medical examination with abnormal findings: Secondary | ICD-10-CM | POA: Diagnosis not present

## 2019-12-07 DIAGNOSIS — K219 Gastro-esophageal reflux disease without esophagitis: Secondary | ICD-10-CM | POA: Diagnosis not present

## 2019-12-07 DIAGNOSIS — R4582 Worries: Secondary | ICD-10-CM | POA: Diagnosis not present

## 2019-12-07 DIAGNOSIS — Z6836 Body mass index (BMI) 36.0-36.9, adult: Secondary | ICD-10-CM | POA: Diagnosis not present

## 2019-12-07 DIAGNOSIS — E782 Mixed hyperlipidemia: Secondary | ICD-10-CM | POA: Diagnosis not present

## 2019-12-07 DIAGNOSIS — M797 Fibromyalgia: Secondary | ICD-10-CM | POA: Diagnosis not present

## 2019-12-16 DIAGNOSIS — E7849 Other hyperlipidemia: Secondary | ICD-10-CM | POA: Diagnosis not present

## 2019-12-16 DIAGNOSIS — I10 Essential (primary) hypertension: Secondary | ICD-10-CM | POA: Diagnosis not present

## 2019-12-16 DIAGNOSIS — R7301 Impaired fasting glucose: Secondary | ICD-10-CM | POA: Diagnosis not present

## 2020-01-05 DIAGNOSIS — E8881 Metabolic syndrome: Secondary | ICD-10-CM | POA: Diagnosis not present

## 2020-01-05 DIAGNOSIS — E782 Mixed hyperlipidemia: Secondary | ICD-10-CM | POA: Diagnosis not present

## 2020-01-10 LAB — METANEPHRINES, URINE, 24 HOUR
Metaneph Total, Ur: 893 mcg/24 h — ABNORMAL HIGH (ref 224–832)
Metanephrines, Ur: 156 mcg/24 h (ref 90–315)
Normetanephrine, 24H Ur: 737 mcg/24 h — ABNORMAL HIGH (ref 122–676)
Volume, Urine-VMAUR: 3100 mL

## 2020-01-10 LAB — T4, FREE: Free T4: 1.1 ng/dL (ref 0.8–1.8)

## 2020-01-10 LAB — METANEPHRINES, PLASMA
Metanephrine, Free: 50 pg/mL (ref <=57)
Normetanephrine, Free: 174 pg/mL — ABNORMAL HIGH (ref <=148)
Total Metanephrines-Plasma: 224 pg/mL — ABNORMAL HIGH (ref <=205)

## 2020-01-10 LAB — CORTISOL, URINE, 24 HOUR
24 Hour urine volume (VMAHVA): 3100 mL
Cortisol (Ur), Free: 21.7 mcg/24 h (ref 4.0–50.0)
RESULTS RECEIVED: 1.62 g/(24.h) (ref 0.50–2.15)

## 2020-01-10 LAB — CATECHOLAMINES, FRACTIONATED, URINE, 24 HOUR
Calc Total (E+NE): 73 mcg/24 h (ref 26–121)
Creatinine, Urine mg/day-CATEUR: 1.34 g/(24.h) (ref 0.50–2.15)
Dopamine 24 Hr Urine: 274 mcg/24 h (ref 52–480)
Norepinephrine, 24H, Ur: 73 mcg/24 h (ref 15–100)
Total Volume: 3100 mL

## 2020-01-10 LAB — TSH: TSH: 1.37 mIU/L

## 2020-01-12 DIAGNOSIS — J329 Chronic sinusitis, unspecified: Secondary | ICD-10-CM | POA: Diagnosis not present

## 2020-01-15 DIAGNOSIS — K219 Gastro-esophageal reflux disease without esophagitis: Secondary | ICD-10-CM | POA: Diagnosis not present

## 2020-01-15 DIAGNOSIS — I10 Essential (primary) hypertension: Secondary | ICD-10-CM | POA: Diagnosis not present

## 2020-01-15 DIAGNOSIS — M797 Fibromyalgia: Secondary | ICD-10-CM | POA: Diagnosis not present

## 2020-01-15 DIAGNOSIS — E7849 Other hyperlipidemia: Secondary | ICD-10-CM | POA: Diagnosis not present

## 2020-01-19 ENCOUNTER — Other Ambulatory Visit: Payer: Self-pay

## 2020-01-19 ENCOUNTER — Encounter: Payer: Self-pay | Admitting: "Endocrinology

## 2020-01-19 ENCOUNTER — Ambulatory Visit: Payer: Medicare PPO | Admitting: "Endocrinology

## 2020-01-19 VITALS — BP 130/88 | HR 68 | Ht 65.0 in | Wt 226.6 lb

## 2020-01-19 DIAGNOSIS — E270 Other adrenocortical overactivity: Secondary | ICD-10-CM | POA: Diagnosis not present

## 2020-01-19 DIAGNOSIS — E8881 Metabolic syndrome: Secondary | ICD-10-CM

## 2020-01-19 NOTE — Progress Notes (Signed)
01/19/2020, 4:56 PM   Endocrinology follow-up note  Subjective:    Patient ID: Melanie Case, female    DOB: Feb 23, 1966, PCP Caryl Bis, MD   Past Medical History:  Diagnosis Date  . ADHD (attention deficit hyperactivity disorder)   . Depression   . Fibromyalgia   . GERD (gastroesophageal reflux disease)   . Hyperlipidemia   . Hypertension   . Long term (current) use of opiate analgesic   . Metabolic syndrome   . Migraines   . Obesity   . Sleep apnea    Past Surgical History:  Procedure Laterality Date  . Left MCA aneurysm coiling  2003   Social History   Socioeconomic History  . Marital status: Married    Spouse name: Not on file  . Number of children: Not on file  . Years of education: Not on file  . Highest education level: Not on file  Occupational History  . Not on file  Tobacco Use  . Smoking status: Former Smoker    Types: Cigarettes    Quit date: 06/18/2001    Years since quitting: 18.6  . Smokeless tobacco: Never Used  Substance and Sexual Activity  . Alcohol use: Yes    Comment: occasional  . Drug use: Never  . Sexual activity: Not on file  Other Topics Concern  . Not on file  Social History Narrative  . Not on file   Social Determinants of Health   Financial Resource Strain:   . Difficulty of Paying Living Expenses:   Food Insecurity:   . Worried About Charity fundraiser in the Last Year:   . Arboriculturist in the Last Year:   Transportation Needs:   . Film/video editor (Medical):   Marland Kitchen Lack of Transportation (Non-Medical):   Physical Activity:   . Days of Exercise per Week:   . Minutes of Exercise per Session:   Stress:   . Feeling of Stress :   Social Connections:   . Frequency of Communication with Friends and Family:   . Frequency of Social Gatherings with Friends and Family:   . Attends Religious Services:   . Active Member of Clubs or Organizations:   . Attends  Archivist Meetings:   Marland Kitchen Marital Status:    Family History  Problem Relation Age of Onset  . Hypertension Brother   . CAD Brother   . Heart attack Mother   . Kidney disease Mother    Outpatient Encounter Medications as of 01/19/2020  Medication Sig  . amphetamine-dextroamphetamine (ADDERALL) 20 MG tablet Take 20 mg by mouth 2 (two) times daily.  Marland Kitchen buPROPion (WELLBUTRIN XL) 150 MG 24 hr tablet TAKE 3 TABLETS BY MOUTH IN THE MORNING  . clonazePAM (KLONOPIN) 0.5 MG tablet Take 0.5 mg by mouth 3 (three) times daily as needed for anxiety.  Marland Kitchen desvenlafaxine (PRISTIQ) 100 MG 24 hr tablet TAKE 1 TABLET BY MOUTH ONCE DAILY  . HYDROcodone-acetaminophen (NORCO) 7.5-325 MG tablet Take 1 tablet by mouth every 6 (six) hours as needed for moderate pain.  Marland Kitchen ibuprofen (ADVIL,MOTRIN) 200 MG tablet Take 200 mg by mouth 2 (two) times daily.  Marland Kitchen loratadine (CLARITIN) 10 MG tablet  Take 10 mg by mouth daily.  Marland Kitchen losartan (COZAAR) 25 MG tablet Take 2 tablets by mouth daily.  Marland Kitchen omeprazole (PRILOSEC) 40 MG capsule Take 40 mg by mouth daily.  . simvastatin (ZOCOR) 40 MG tablet Take 40 mg by mouth daily.  Marland Kitchen topiramate (TOPAMAX) 100 MG tablet Take 100 mg by mouth 2 (two) times daily.  Marland Kitchen triamcinolone cream (KENALOG) 0.1 % Apply 1 application topically 3 (three) times daily.  . [DISCONTINUED] atenolol (TENORMIN) 50 MG tablet Take 50 mg by mouth daily.   No facility-administered encounter medications on file as of 01/19/2020.   ALLERGIES: No Known Allergies  VACCINATION STATUS:  There is no immunization history on file for this patient.  HPI Evanna P Case is 54 y.o. female who presents today with a medical history as above. she is being seen in follow-up after she was seen in consultation for metabolic syndrome requested by Caryl Bis, MD.   -See notes from prior visit.  During her last visit, point-of-care A1c was 5.4% indicating absence of prediabetes/diabetes.  Her previsit work-up ruled out  thyroid dysfunction nor Cushing syndrome.  Due to her complaint of hyperhidrosis, she was offered evaluation for pheochromocytoma. Her 24-hour urine metanephrines were slightly elevated at 174 (normal less than 148), 24-hour urine normetanephrine's with slightly elevated at 737, as well as metanephrine slightly elevated at 893.  Summarized below. She denies any family history of pheochromocytoma nor paraganglioma.  Of note, these labs were done while she was taking beta-blocker, as well as hydrocodone/acetaminophen.  She provides history consistent with metabolic syndrome for which she was initiated on Metformin currently 500 mg p.o. twice daily.  She continues to have hyperhidrosis, steady weight since last visit.  She has multiple complaints including  vaginal dryness, skin dryness, fatigue, low libido. Her dietary habit is heavy, consumption of sweetened beverages.  She has hypertension and hyperlipidemia on treatment with losartan and simvastatin respectively.  She has 1 grown child, denies any history of gestational diabetes.  She denies any prior history of thyroid dysfunction.  She is currently on topiramate 100 mg p.o. daily for migraine headaches.  She is on various antidepressants and mood stabilizers.    Review of Systems  Constitutional: + Steady weight since last visit,  + fatigue, + subjective hyperthermia,  Eyes: no blurry vision, no xerophthalmia ENT: no sore throat, no nodules palpated in throat, no dysphagia/odynophagia, no hoarseness Cardiovascular: no Chest Pain, no Shortness of Breath, no palpitations, no leg swelling Respiratory: no cough, no shortness of breath Gastrointestinal: no Nausea/Vomiting/Diarhhea Musculoskeletal: no muscle/joint aches Skin: no rashes, + hyperhydrosis Neurological: no tremors, no numbness, no tingling, no dizziness Psychiatric: no depression, no anxiety  Objective:    Vitals with BMI 01/19/2020 10/06/2019 12/20/2017  Height 5' 5"  5' 5"  5' 5"    Weight 226 lbs 10 oz 226 lbs 6 oz 216 lbs  BMI 37.71 14.48 18.56  Systolic 314 970 263  Diastolic 88 82 80  Pulse 68 76 73    BP 130/88   Pulse 68   Ht 5' 5"  (1.651 m)   Wt 226 lb 9.6 oz (102.8 kg)   LMP 04/18/2017 (Approximate)   BMI 37.71 kg/m   Wt Readings from Last 3 Encounters:  01/19/20 226 lb 9.6 oz (102.8 kg)  10/06/19 226 lb 6.4 oz (102.7 kg)  12/20/17 216 lb (98 kg)    Physical Exam  Constitutional:  Body mass index is 37.71 kg/m.,  not in acute distress, normal state of mind Eyes:  PERRLA, EOMI, no exophthalmos ENT: moist mucous membranes, no gross thyromegaly, no gross cervical lymphadenopathy, + mild dorsal cervical fat pad.+ Supraclavicular fullness. Cardiovascular: normal precordial activity, Regular Rate and Rhythm, no Murmur/Rubs/Gallops Respiratory:  adequate breathing efforts, no gross chest deformity, Clear to auscultation bilaterally Gastrointestinal: abdomen soft, Non -tender, No distension, Bowel Sounds present, no gross organomegaly Musculoskeletal: no gross deformities, strength intact in all four extremities Skin:  warm, no rashes, + dry scaly skin on lower extremities Neurological: no tremor with outstretched hands, Deep tendon reflexes normal in bilateral lower extremities.  CMP ( most recent) CMP     Component Value Date/Time   BUN 11 10/04/2010 1620   CREATININE 0.85 10/04/2010 1620   GFRNONAA >60 10/04/2010 1620   GFRAA  10/04/2010 1620    >60        The eGFR has been calculated using the MDRD equation. This calculation has not been validated in all clinical situations. eGFR's persistently <60 mL/min signify possible Chronic Kidney Disease.     Diabetic Labs (most recent): Lab Results  Component Value Date   HGBA1C 5.4 10/06/2019   Recent Results (from the past 2160 hour(s))  TSH     Status: None   Collection Time: 01/05/20  2:24 PM  Result Value Ref Range   TSH 1.37 mIU/L    Comment:           Reference Range .            > or = 20 Years  0.40-4.50 .                Pregnancy Ranges           First trimester    0.26-2.66           Second trimester   0.55-2.73           Third trimester    0.43-2.91   T4, free     Status: None   Collection Time: 01/05/20  2:24 PM  Result Value Ref Range   Free T4 1.1 0.8 - 1.8 ng/dL  Metanephrines, plasma     Status: Abnormal   Collection Time: 01/05/20  2:24 PM  Result Value Ref Range   Metanephrine, Free 50 <=57 pg/mL    Comment: . This test was developed and its analytical performance characteristics have been determined by [place], [place]. It has not been cleared or approved by the U.S. Food and Drug Administration. This assay has been validated pursuant to the CLIA regulations and is used for clinical purposes. .    Normetanephrine, Free 174 (H) <=148 pg/mL    Comment: . This test was developed and its analytical performance characteristics have been determined by [place], [place]. It has not been cleared or approved by the U.S. Food and Drug Administration. This assay has been validated pursuant to the CLIA regulations and is used for clinical purposes. .    Total Metanephrines-Plasma 224 (H) <=205 pg/mL    Comment: . For additional information, please refer to http://education.questdiagnostics.com/faq/MetFractFree (This link is being provided for informational/educatio informational/educational purposes only.) . Elevations >4-fold upper reference range: strongly suggestive of a pheochromocytoma(1). . Elevations >1- 4-fold upper reference range: significant but not diagnostic, may be due to medications or stress. Suggest running 24 hr urine fractionated metanephrines and/or serum Chromagranin A for confirmation. . Reference: . (1)Algeciras-Schimnich A et al, Plasma Chromogranin A or Urine Fractionated Metanephrines Follow-Up Testing Improves the Diagnostic Accuracy of Plasma  Fractionated Metanephrines for Pheochromocytoma. The Journal of Clinical Endocrinology # Metabolism 93(1), 91-95, 2008. . . . This test was developed and its analytical performance characteristics have been determined by [place], [place]. It has not been cleared or a pproved by the U.S. Food and Drug Administration. This assay has been validated pursuant to the CLIA regulations and is used for clinical purposes. .   Metanephrines, urine, 24 hour     Status: Abnormal   Collection Time: 01/05/20  2:24 PM  Result Value Ref Range   Volume, Urine-VMAUR 3,100 mL   Metanephrines, Ur 156 90 - 315 mcg/24 h    Comment: . This test was developed and its analytical performance characteristics have been determined by [place], [place]. It has not been cleared or approved by the U.S. Food and Drug Administration. This assay has been validated pursuant to the CLIA regulations and is used for clinical purposes. .    Normetanephrine, 24H Ur 737 (H) 122 - 676 mcg/24 h    Comment: . This test was developed and its analytical performance characteristics have been determined by [place], [place]. It has not been cleared or approved by the U.S. Food and Drug Administration. This assay has been validated pursuant to the CLIA regulations and is used for clinical purposes. Cathey Endow Total, Ur 893 (H) 224 - 832 mcg/24 h    Comment: . A four fold elevation of urinary normetanephrines is extremely likely to be due to a tumor, while a four fold elevation of urinary metanephrines is highly suggestive, but not diagnostic of the tumor. Measurement of plasma Metanephrines and Chromogranin A is recommended for confirmation. .   Catecholamines, fractionated, urine, 24 hour     Status: None   Collection Time: 01/05/20  2:24 PM  Result Value Ref Range   Total Volume 3,100 mL   Epinephrine, 24H, Ur  see note mcg/24 h    Comment: Results are below the reportable range for this analyte, which is 2.0 mcg/L. Marland Kitchen This test was developed and its analytical performance characteristics have been determined by [place], [place]. It has not been cleared or approved by the U.S. Food and Drug Administration. This assay has been validated pursuant to the CLIA regulations and is used for clinical purposes. .    Norepinephrine, 24H, Ur 73 15 - 100 mcg/24 h    Comment: . This test was developed and its analytical performance characteristics have been determined by [place], [place]. It has not been cleared or approved by the U.S. Food and Drug Administration. This assay has been validated pursuant to the CLIA regulations and is used for clinical purposes. Tamela Oddi Total (E+NE) 73 26 - 121 mcg/24 h    Comment: . This test was developed and its analytical performance characteristics have been determined by [place], [place]. It has not been cleared or approved by the U.S. Food and Drug Administration. This assay has been validated pursuant to the CLIA regulations and is used for clinical purposes. .    Dopamine 24 Hr Urine 274 52 - 480 mcg/24 h    Comment: . This test was developed and its analytical performance characteristics have been determined by [place], [place]. It has not been cleared or approved by the U.S. Food and Drug Administration. This assay has been validated pursuant to the [employer]  and is used for clinical purposes. .    Creatinine, Urine mg/day-CATEUR 1.34 0.50 - 2.15 g/24 h  Cortisol,free,24 hour urine w/creatinine     Status: None   Collection Time: 01/05/20  2:24 PM  Result Value Ref Range   24 Hour urine volume (VMAHVA) 3,100 mL   Cortisol (Ur), Free 21.7 4.0 - 50.0 mcg/24 h    Comment: . This test was developed and its  analytical performance characteristics have been determined by [facility]. It has not been cleared or approved by FDA. This assay has been validated pursuant to the CLIA regulations and is used for clinical purposes.    RESULTS RECEIVED 1.62 0.50 - 2.15 g/24 h     Assessment & Plan:   1. Metabolic syndrome  - Eura P Aurich  is being seen at a kind request of Caryl Bis, MD. -I have reviewed her recent labs for endocrine evaluation.  She does not have Cushing syndrome, thyroid dysfunction, nor diabetes/prediabetes.   She would benefit the most from weight loss. - she  admits there is a room for improvement in her diet and drink choices. -  Suggestion is made for her to avoid simple carbohydrates  from her diet including Cakes, Sweet Desserts / Pastries, Ice Cream, Soda (diet and regular), Sweet Tea, Candies, Chips, Cookies, Sweet Pastries,  Store Bought Juices, Alcohol in Excess of  1-2 drinks a day, Artificial Sweeteners, Coffee Creamer, and "Sugar-free" Products. This will help patient to have stable blood glucose profile and potentially avoid unintended weight gain.   She is advised to continue Metformin 500 mg p.o. twice daily.  Regarding her complaint of intermittent hyperhidrosis, her 24-hour urine studies for catecholamines and metanephrines were slightly abnormal.  When elevations are less than 4 times a 35 range, significant but not diagnostic.  This may be due to medications or stress.   There are several medications that she cannot take herself off of.  I had a long discussion with her the need for repeated positive tests to conclude a diagnosis of pheochromocytoma.  Pretest probability in her case is very low.  She can hold atenolol while increasing her losartan to control BP, however insists that she is going to need her hydrocodone/acetaminophen.  She will have repeat 24-hour urine studies as well as plasma metanephrines in 15  days. In her case, hyperhidrosis could still be  a result of her perimenopausal estrogen deficiency evidenced by her complaint of vaginal dryness.  She is advised to seek evaluation by OB/GYN.    She may need evaluation by dermatology for possible biopsy of one of the scaly lesions.   - I did not initiate any new prescriptions today. - she is advised to maintain close follow up with Caryl Bis, MD for primary care needs.     - Time spent on this patient care encounter:  20 minutes of which 50% was spent in  counseling and the rest reviewing  her current and  previous labs / studies and medications  doses and developing a plan for long term care. Mckynleigh P Debow  participated in the discussions, expressed understanding, and voiced agreement with the above plans.  All questions were answered to her satisfaction. she is encouraged to contact clinic should she have any questions or concerns prior to her return visit.   Follow up plan: Return in about 3 weeks (around 02/09/2020), or labs after 2 weeks off meds discussed, for F/U with Pre-visit  Labs.   Glade Lloyd, MD [facility] [address] [place], [place] [address] Phone: [phone]  Fax: [phone]     01/19/2020, 4:56 PM  This note was partially dictated with voice recognition software. Similar sounding words can be transcribed inadequately or may not  be corrected upon review.

## 2020-02-02 DIAGNOSIS — L859 Epidermal thickening, unspecified: Secondary | ICD-10-CM | POA: Diagnosis not present

## 2020-02-09 ENCOUNTER — Ambulatory Visit: Payer: Medicare PPO | Admitting: "Endocrinology

## 2020-03-01 DIAGNOSIS — L853 Xerosis cutis: Secondary | ICD-10-CM | POA: Diagnosis not present

## 2020-03-01 DIAGNOSIS — L570 Actinic keratosis: Secondary | ICD-10-CM | POA: Diagnosis not present

## 2020-03-01 DIAGNOSIS — L821 Other seborrheic keratosis: Secondary | ICD-10-CM | POA: Diagnosis not present

## 2020-03-08 DIAGNOSIS — I10 Essential (primary) hypertension: Secondary | ICD-10-CM | POA: Diagnosis not present

## 2020-03-08 DIAGNOSIS — E782 Mixed hyperlipidemia: Secondary | ICD-10-CM | POA: Diagnosis not present

## 2020-03-08 DIAGNOSIS — R946 Abnormal results of thyroid function studies: Secondary | ICD-10-CM | POA: Diagnosis not present

## 2020-03-08 DIAGNOSIS — K219 Gastro-esophageal reflux disease without esophagitis: Secondary | ICD-10-CM | POA: Diagnosis not present

## 2020-03-08 DIAGNOSIS — K5732 Diverticulitis of large intestine without perforation or abscess without bleeding: Secondary | ICD-10-CM | POA: Diagnosis not present

## 2020-03-08 DIAGNOSIS — E8881 Metabolic syndrome: Secondary | ICD-10-CM | POA: Diagnosis not present

## 2020-03-08 DIAGNOSIS — R7301 Impaired fasting glucose: Secondary | ICD-10-CM | POA: Diagnosis not present

## 2020-03-10 DIAGNOSIS — I10 Essential (primary) hypertension: Secondary | ICD-10-CM | POA: Diagnosis not present

## 2020-03-10 DIAGNOSIS — Z23 Encounter for immunization: Secondary | ICD-10-CM | POA: Diagnosis not present

## 2020-03-10 DIAGNOSIS — G4733 Obstructive sleep apnea (adult) (pediatric): Secondary | ICD-10-CM | POA: Diagnosis not present

## 2020-03-10 DIAGNOSIS — M797 Fibromyalgia: Secondary | ICD-10-CM | POA: Diagnosis not present

## 2020-03-10 DIAGNOSIS — F900 Attention-deficit hyperactivity disorder, predominantly inattentive type: Secondary | ICD-10-CM | POA: Diagnosis not present

## 2020-03-10 DIAGNOSIS — F331 Major depressive disorder, recurrent, moderate: Secondary | ICD-10-CM | POA: Diagnosis not present

## 2020-03-10 DIAGNOSIS — K219 Gastro-esophageal reflux disease without esophagitis: Secondary | ICD-10-CM | POA: Diagnosis not present

## 2020-03-10 DIAGNOSIS — E8881 Metabolic syndrome: Secondary | ICD-10-CM | POA: Diagnosis not present

## 2020-03-10 DIAGNOSIS — R7301 Impaired fasting glucose: Secondary | ICD-10-CM | POA: Diagnosis not present

## 2020-03-10 DIAGNOSIS — Z9189 Other specified personal risk factors, not elsewhere classified: Secondary | ICD-10-CM | POA: Diagnosis not present

## 2020-03-10 DIAGNOSIS — E782 Mixed hyperlipidemia: Secondary | ICD-10-CM | POA: Diagnosis not present

## 2020-03-10 DIAGNOSIS — R4582 Worries: Secondary | ICD-10-CM | POA: Diagnosis not present

## 2020-03-15 ENCOUNTER — Other Ambulatory Visit: Payer: Self-pay

## 2020-03-15 DIAGNOSIS — E270 Other adrenocortical overactivity: Secondary | ICD-10-CM

## 2020-03-17 DIAGNOSIS — M47816 Spondylosis without myelopathy or radiculopathy, lumbar region: Secondary | ICD-10-CM | POA: Diagnosis not present

## 2020-03-17 DIAGNOSIS — M797 Fibromyalgia: Secondary | ICD-10-CM | POA: Diagnosis not present

## 2020-03-17 DIAGNOSIS — G8929 Other chronic pain: Secondary | ICD-10-CM | POA: Diagnosis not present

## 2020-04-01 ENCOUNTER — Other Ambulatory Visit: Payer: Self-pay

## 2020-04-01 ENCOUNTER — Other Ambulatory Visit (HOSPITAL_COMMUNITY)
Admission: RE | Admit: 2020-04-01 | Discharge: 2020-04-01 | Disposition: A | Discharge status: Home / Self Care | Payer: Medicare PPO | Source: Ambulatory Visit | Attending: "Endocrinology | Admitting: "Endocrinology

## 2020-04-01 DIAGNOSIS — E270 Other adrenocortical overactivity: Secondary | ICD-10-CM | POA: Insufficient documentation

## 2020-04-05 ENCOUNTER — Telehealth: Payer: Self-pay

## 2020-04-05 ENCOUNTER — Other Ambulatory Visit: Payer: Self-pay

## 2020-04-05 DIAGNOSIS — E8881 Metabolic syndrome: Secondary | ICD-10-CM

## 2020-04-05 NOTE — Telephone Encounter (Signed)
Patient left a VM that she wants her results released to my chart. No upcoming appt.

## 2020-04-13 DIAGNOSIS — N644 Mastodynia: Secondary | ICD-10-CM | POA: Diagnosis not present

## 2020-04-13 DIAGNOSIS — N6341 Unspecified lump in right breast, subareolar: Secondary | ICD-10-CM | POA: Diagnosis not present

## 2020-04-13 DIAGNOSIS — N6320 Unspecified lump in the left breast, unspecified quadrant: Secondary | ICD-10-CM | POA: Diagnosis not present

## 2020-04-18 DIAGNOSIS — M47816 Spondylosis without myelopathy or radiculopathy, lumbar region: Secondary | ICD-10-CM | POA: Diagnosis not present

## 2020-05-02 DIAGNOSIS — M47816 Spondylosis without myelopathy or radiculopathy, lumbar region: Secondary | ICD-10-CM | POA: Diagnosis not present

## 2020-06-02 DIAGNOSIS — Z1329 Encounter for screening for other suspected endocrine disorder: Secondary | ICD-10-CM | POA: Diagnosis not present

## 2020-06-02 DIAGNOSIS — I10 Essential (primary) hypertension: Secondary | ICD-10-CM | POA: Diagnosis not present

## 2020-06-02 DIAGNOSIS — Z79891 Long term (current) use of opiate analgesic: Secondary | ICD-10-CM | POA: Diagnosis not present

## 2020-06-02 DIAGNOSIS — F900 Attention-deficit hyperactivity disorder, predominantly inattentive type: Secondary | ICD-10-CM | POA: Diagnosis not present

## 2020-06-02 DIAGNOSIS — K219 Gastro-esophageal reflux disease without esophagitis: Secondary | ICD-10-CM | POA: Diagnosis not present

## 2020-06-02 DIAGNOSIS — Z1159 Encounter for screening for other viral diseases: Secondary | ICD-10-CM | POA: Diagnosis not present

## 2020-06-02 DIAGNOSIS — E8881 Metabolic syndrome: Secondary | ICD-10-CM | POA: Diagnosis not present

## 2020-06-02 DIAGNOSIS — E782 Mixed hyperlipidemia: Secondary | ICD-10-CM | POA: Diagnosis not present

## 2020-06-08 DIAGNOSIS — Z79899 Other long term (current) drug therapy: Secondary | ICD-10-CM | POA: Diagnosis not present

## 2020-06-08 DIAGNOSIS — G43919 Migraine, unspecified, intractable, without status migrainosus: Secondary | ICD-10-CM | POA: Diagnosis not present

## 2020-06-08 DIAGNOSIS — F900 Attention-deficit hyperactivity disorder, predominantly inattentive type: Secondary | ICD-10-CM | POA: Diagnosis not present

## 2020-06-08 DIAGNOSIS — R4582 Worries: Secondary | ICD-10-CM | POA: Diagnosis not present

## 2020-06-08 DIAGNOSIS — J019 Acute sinusitis, unspecified: Secondary | ICD-10-CM | POA: Diagnosis not present

## 2020-06-08 DIAGNOSIS — Z6836 Body mass index (BMI) 36.0-36.9, adult: Secondary | ICD-10-CM | POA: Diagnosis not present

## 2020-06-08 DIAGNOSIS — I10 Essential (primary) hypertension: Secondary | ICD-10-CM | POA: Diagnosis not present

## 2020-06-08 DIAGNOSIS — F331 Major depressive disorder, recurrent, moderate: Secondary | ICD-10-CM | POA: Diagnosis not present

## 2020-06-08 DIAGNOSIS — K2100 Gastro-esophageal reflux disease with esophagitis, without bleeding: Secondary | ICD-10-CM | POA: Diagnosis not present

## 2020-06-08 DIAGNOSIS — E8881 Metabolic syndrome: Secondary | ICD-10-CM | POA: Diagnosis not present

## 2020-06-21 DIAGNOSIS — J329 Chronic sinusitis, unspecified: Secondary | ICD-10-CM | POA: Diagnosis not present

## 2020-06-21 DIAGNOSIS — J45901 Unspecified asthma with (acute) exacerbation: Secondary | ICD-10-CM | POA: Diagnosis not present

## 2020-06-22 DIAGNOSIS — M47817 Spondylosis without myelopathy or radiculopathy, lumbosacral region: Secondary | ICD-10-CM | POA: Diagnosis not present

## 2020-08-01 DIAGNOSIS — Z6836 Body mass index (BMI) 36.0-36.9, adult: Secondary | ICD-10-CM | POA: Diagnosis not present

## 2020-08-01 DIAGNOSIS — S39012A Strain of muscle, fascia and tendon of lower back, initial encounter: Secondary | ICD-10-CM | POA: Diagnosis not present

## 2020-08-01 DIAGNOSIS — G43919 Migraine, unspecified, intractable, without status migrainosus: Secondary | ICD-10-CM | POA: Diagnosis not present

## 2020-08-12 ENCOUNTER — Encounter: Payer: Self-pay | Admitting: Neurology

## 2020-08-15 DIAGNOSIS — M5441 Lumbago with sciatica, right side: Secondary | ICD-10-CM | POA: Diagnosis not present

## 2020-08-15 DIAGNOSIS — M461 Sacroiliitis, not elsewhere classified: Secondary | ICD-10-CM | POA: Diagnosis not present

## 2020-08-15 DIAGNOSIS — G8929 Other chronic pain: Secondary | ICD-10-CM | POA: Diagnosis not present

## 2020-08-15 DIAGNOSIS — M5442 Lumbago with sciatica, left side: Secondary | ICD-10-CM | POA: Diagnosis not present

## 2020-09-12 DIAGNOSIS — E7849 Other hyperlipidemia: Secondary | ICD-10-CM | POA: Diagnosis not present

## 2020-09-12 DIAGNOSIS — Z1329 Encounter for screening for other suspected endocrine disorder: Secondary | ICD-10-CM | POA: Diagnosis not present

## 2020-09-12 DIAGNOSIS — G8929 Other chronic pain: Secondary | ICD-10-CM | POA: Diagnosis not present

## 2020-09-12 DIAGNOSIS — K2100 Gastro-esophageal reflux disease with esophagitis, without bleeding: Secondary | ICD-10-CM | POA: Diagnosis not present

## 2020-09-12 DIAGNOSIS — E782 Mixed hyperlipidemia: Secondary | ICD-10-CM | POA: Diagnosis not present

## 2020-09-12 DIAGNOSIS — M461 Sacroiliitis, not elsewhere classified: Secondary | ICD-10-CM | POA: Diagnosis not present

## 2020-09-12 DIAGNOSIS — M47816 Spondylosis without myelopathy or radiculopathy, lumbar region: Secondary | ICD-10-CM | POA: Diagnosis not present

## 2020-09-12 DIAGNOSIS — R7301 Impaired fasting glucose: Secondary | ICD-10-CM | POA: Diagnosis not present

## 2020-09-12 DIAGNOSIS — I10 Essential (primary) hypertension: Secondary | ICD-10-CM | POA: Diagnosis not present

## 2020-09-12 DIAGNOSIS — Z79891 Long term (current) use of opiate analgesic: Secondary | ICD-10-CM | POA: Diagnosis not present

## 2020-09-12 DIAGNOSIS — M797 Fibromyalgia: Secondary | ICD-10-CM | POA: Diagnosis not present

## 2020-09-12 DIAGNOSIS — E8881 Metabolic syndrome: Secondary | ICD-10-CM | POA: Diagnosis not present

## 2020-09-16 DIAGNOSIS — Z0001 Encounter for general adult medical examination with abnormal findings: Secondary | ICD-10-CM | POA: Diagnosis not present

## 2020-09-16 DIAGNOSIS — O086 Damage to pelvic organs and tissues following an ectopic and molar pregnancy: Secondary | ICD-10-CM | POA: Diagnosis not present

## 2020-09-16 DIAGNOSIS — G43919 Migraine, unspecified, intractable, without status migrainosus: Secondary | ICD-10-CM | POA: Diagnosis not present

## 2020-09-16 DIAGNOSIS — R4582 Worries: Secondary | ICD-10-CM | POA: Diagnosis not present

## 2020-09-16 DIAGNOSIS — M797 Fibromyalgia: Secondary | ICD-10-CM | POA: Diagnosis not present

## 2020-09-16 DIAGNOSIS — K2100 Gastro-esophageal reflux disease with esophagitis, without bleeding: Secondary | ICD-10-CM | POA: Diagnosis not present

## 2020-09-16 DIAGNOSIS — F900 Attention-deficit hyperactivity disorder, predominantly inattentive type: Secondary | ICD-10-CM | POA: Diagnosis not present

## 2020-09-16 DIAGNOSIS — F331 Major depressive disorder, recurrent, moderate: Secondary | ICD-10-CM | POA: Diagnosis not present

## 2020-09-16 DIAGNOSIS — E8881 Metabolic syndrome: Secondary | ICD-10-CM | POA: Diagnosis not present

## 2020-09-16 DIAGNOSIS — Z1212 Encounter for screening for malignant neoplasm of rectum: Secondary | ICD-10-CM | POA: Diagnosis not present

## 2020-09-16 DIAGNOSIS — J019 Acute sinusitis, unspecified: Secondary | ICD-10-CM | POA: Diagnosis not present

## 2020-09-16 DIAGNOSIS — Z23 Encounter for immunization: Secondary | ICD-10-CM | POA: Diagnosis not present

## 2020-09-16 DIAGNOSIS — D806 Antibody deficiency with near-normal immunoglobulins or with hyperimmunoglobulinemia: Secondary | ICD-10-CM | POA: Diagnosis not present

## 2020-09-16 DIAGNOSIS — R700 Elevated erythrocyte sedimentation rate: Secondary | ICD-10-CM | POA: Diagnosis not present

## 2020-09-16 DIAGNOSIS — I10 Essential (primary) hypertension: Secondary | ICD-10-CM | POA: Diagnosis not present

## 2020-09-20 DIAGNOSIS — M461 Sacroiliitis, not elsewhere classified: Secondary | ICD-10-CM | POA: Diagnosis not present

## 2020-09-20 DIAGNOSIS — M47816 Spondylosis without myelopathy or radiculopathy, lumbar region: Secondary | ICD-10-CM | POA: Diagnosis not present

## 2020-09-20 DIAGNOSIS — G8929 Other chronic pain: Secondary | ICD-10-CM | POA: Diagnosis not present

## 2020-09-20 DIAGNOSIS — M797 Fibromyalgia: Secondary | ICD-10-CM | POA: Diagnosis not present

## 2020-09-22 DIAGNOSIS — Z23 Encounter for immunization: Secondary | ICD-10-CM | POA: Diagnosis not present

## 2020-09-27 DIAGNOSIS — M797 Fibromyalgia: Secondary | ICD-10-CM | POA: Diagnosis not present

## 2020-09-27 DIAGNOSIS — M461 Sacroiliitis, not elsewhere classified: Secondary | ICD-10-CM | POA: Diagnosis not present

## 2020-09-27 DIAGNOSIS — M47816 Spondylosis without myelopathy or radiculopathy, lumbar region: Secondary | ICD-10-CM | POA: Diagnosis not present

## 2020-09-27 DIAGNOSIS — G8929 Other chronic pain: Secondary | ICD-10-CM | POA: Diagnosis not present

## 2020-09-29 DIAGNOSIS — G8929 Other chronic pain: Secondary | ICD-10-CM | POA: Diagnosis not present

## 2020-09-29 DIAGNOSIS — M47816 Spondylosis without myelopathy or radiculopathy, lumbar region: Secondary | ICD-10-CM | POA: Diagnosis not present

## 2020-09-29 DIAGNOSIS — M797 Fibromyalgia: Secondary | ICD-10-CM | POA: Diagnosis not present

## 2020-09-29 DIAGNOSIS — M461 Sacroiliitis, not elsewhere classified: Secondary | ICD-10-CM | POA: Diagnosis not present

## 2020-10-16 NOTE — Progress Notes (Deleted)
NEUROLOGY CONSULTATION NOTE  ALICESON DOLBOW MRN: [id number] DOB: 19-Aug-1965  Referring provider: Donzetta Sprung, MD Primary care provider: Donzetta Sprung, MD  Reason for consult:  migraines  Assessment/Plan:   ***   Subjective:  Melanie Case is a 55 year old ***-handed female with ADHD, HTN, HLD, fibromyalgia, chronic back pain, depression and history of cerebral aneurysm s/p coiling who presents for migraines.  History supplemented by referring provider's notes.  ***  History of brain aneurysm coiling.  Due to worsening headaches, she had MRI of brain without contrast on 09/04/2017 which was personally reviewed and showed stable left MCA aneurysm coiling and small hyperintense focus in left occipital parietal white matter, stable compared to prior imaging from 2009.  Current NSAIDS/analgesics:  Motrin, Norco (chronic pain) Current triptans:  *** Current ergotamine:  *** Current anti-emetic:  *** Current muscle relaxants:  *** Current Antihypertensive medications:  atenolol 50mg  QD, losartan Current Antidepressant medications:  Desvenlafaxine, Wellbutrin Current Anticonvulsant medications:  topiramate 100mg  BID Current anti-CGRP:  Ajovy Current Vitamins/Herbal/Supplements:  *** Current Antihistamines/Decongestants:  loraditine Other therapy:  *** Hormone/birth control:  *** Other medications:  Adderall, Klonopin  Past NSAIDS/analgesics:  *** Past abortive triptans:  *** Past abortive ergotamine:  *** Past muscle relaxants:  *** Past anti-emetic:  *** Past antihypertensive medications:  *** Past antidepressant medications:  *** Past anticonvulsant medications:  *** Past anti-CGRP:  *** Past vitamins/Herbal/Supplements:  *** Past antihistamines/decongestants:  *** Other past therapies:  ***  Caffeine:  *** Alcohol:  *** Smoker:  *** Diet:  *** Exercise:  *** Depression:  ***; Anxiety:  *** Other pain:  Fibromyalgia.  Chronic back pain.  On opioid management.    Sleep hygiene:  *** Family history of headache:  ***      PAST MEDICAL HISTORY: Past Medical History:  Diagnosis Date  . ADHD (attention deficit hyperactivity disorder)   . Depression   . Fibromyalgia   . GERD (gastroesophageal reflux disease)   . Hyperlipidemia   . Hypertension   . Long term (current) use of opiate analgesic   . Metabolic syndrome   . Migraines   . Obesity   . Sleep apnea     PAST SURGICAL HISTORY: Past Surgical History:  Procedure Laterality Date  . Left MCA aneurysm coiling  2003    MEDICATIONS: Current Outpatient Medications on File Prior to Visit  Medication Sig Dispense Refill  . amphetamine-dextroamphetamine (ADDERALL) 20 MG tablet Take 20 mg by mouth 2 (two) times daily.    buPROPion (WELLBUTRIN XL) 150 MG 24 hr tablet TAKE 3 TABLETS BY MOUTH IN THE MORNING 90 tablet 0  . clonazePAM (KLONOPIN) 0.5 MG tablet Take 0.5 mg by mouth 3 (three) times daily as needed for anxiety.    2004 desvenlafaxine (PRISTIQ) 100 MG 24 hr tablet TAKE 1 TABLET BY MOUTH ONCE DAILY 30 tablet 0  . HYDROcodone-acetaminophen (NORCO) 7.5-325 MG tablet Take 1 tablet by mouth every 6 (six) hours as needed for moderate pain.    Marland Kitchen ibuprofen (ADVIL,MOTRIN) 200 MG tablet Take 200 mg by mouth 2 (two) times daily.    10-16-1976 loratadine (CLARITIN) 10 MG tablet Take 10 mg by mouth daily.    Marland Kitchen losartan (COZAAR) 25 MG tablet Take 2 tablets by mouth daily.    Marland Kitchen omeprazole (PRILOSEC) 40 MG capsule Take 40 mg by mouth daily.  4  . simvastatin (ZOCOR) 40 MG tablet Take 40 mg by mouth daily.    Marland Kitchen topiramate (TOPAMAX) 100 MG tablet Take  100 mg by mouth 2 (two) times daily.    Marland Kitchen triamcinolone cream (KENALOG) 0.1 % Apply 1 application topically 3 (three) times daily.     No current facility-administered medications on file prior to visit.    ALLERGIES: No Known Allergies  FAMILY HISTORY: Family History  Problem Relation Age of Onset  . Hypertension Brother   . CAD Brother   . Heart attack  Mother   . Kidney disease Mother     Objective:  *** General: No acute distress.  Patient appears well-groomed.   Head:  Normocephalic/atraumatic Eyes:  fundi examined but not visualized Neck: supple, no paraspinal tenderness, full range of motion Back: No paraspinal tenderness Heart: regular rate and rhythm Lungs: Clear to auscultation bilaterally. Vascular: No carotid bruits. Neurological Exam: Mental status: alert and oriented to person, place, and time, recent and remote memory intact, fund of knowledge intact, attention and concentration intact, speech fluent and not dysarthric, language intact. Cranial nerves: CN I: not tested CN II: pupils equal, round and reactive to light, visual fields intact CN III, IV, VI:  full range of motion, no nystagmus, no ptosis CN V: facial sensation intact. CN VII: upper and lower face symmetric CN VIII: hearing intact CN IX, X: gag intact, uvula midline CN XI: sternocleidomastoid and trapezius muscles intact CN XII: tongue midline Bulk & Tone: normal, no fasciculations. Motor:  muscle strength 5/5 throughout Sensation:  Pinprick, temperature and vibratory sensation intact. Deep Tendon Reflexes:  2+ throughout,  toes downgoing.   Finger to nose testing:  Without dysmetria.   Heel to shin:  Without dysmetria.   Gait:  Normal station and stride.  Romberg negative.    Thank you for allowing me to take part in the care of this patient.  Shon Millet, DO  CC: Donzetta Sprung, MD

## 2020-10-17 ENCOUNTER — Ambulatory Visit: Payer: Medicare PPO | Admitting: Neurology

## 2020-12-12 DIAGNOSIS — I10 Essential (primary) hypertension: Secondary | ICD-10-CM | POA: Diagnosis not present

## 2020-12-12 DIAGNOSIS — K2100 Gastro-esophageal reflux disease with esophagitis, without bleeding: Secondary | ICD-10-CM | POA: Diagnosis not present

## 2020-12-12 DIAGNOSIS — E8881 Metabolic syndrome: Secondary | ICD-10-CM | POA: Diagnosis not present

## 2020-12-12 DIAGNOSIS — R7301 Impaired fasting glucose: Secondary | ICD-10-CM | POA: Diagnosis not present

## 2020-12-12 DIAGNOSIS — E782 Mixed hyperlipidemia: Secondary | ICD-10-CM | POA: Diagnosis not present

## 2020-12-15 DIAGNOSIS — F900 Attention-deficit hyperactivity disorder, predominantly inattentive type: Secondary | ICD-10-CM | POA: Diagnosis not present

## 2020-12-15 DIAGNOSIS — R4582 Worries: Secondary | ICD-10-CM | POA: Diagnosis not present

## 2020-12-15 DIAGNOSIS — I10 Essential (primary) hypertension: Secondary | ICD-10-CM | POA: Diagnosis not present

## 2020-12-15 DIAGNOSIS — G43919 Migraine, unspecified, intractable, without status migrainosus: Secondary | ICD-10-CM | POA: Diagnosis not present

## 2020-12-15 DIAGNOSIS — J019 Acute sinusitis, unspecified: Secondary | ICD-10-CM | POA: Diagnosis not present

## 2020-12-15 DIAGNOSIS — M797 Fibromyalgia: Secondary | ICD-10-CM | POA: Diagnosis not present

## 2020-12-15 DIAGNOSIS — E8881 Metabolic syndrome: Secondary | ICD-10-CM | POA: Diagnosis not present

## 2020-12-15 DIAGNOSIS — K2100 Gastro-esophageal reflux disease with esophagitis, without bleeding: Secondary | ICD-10-CM | POA: Diagnosis not present

## 2020-12-26 NOTE — Progress Notes (Signed)
NEUROLOGY CONSULTATION NOTE  [name] MRN: [id number] DOB: Oct 23, 1965  Referring provider: Day Hildred Alamin, MD Primary care provider: Day Angiulli, MD  Reason for consult:  headache  Assessment/Plan:   Migraine without aura, without status migrainosus, not intractable History of cerebral aneurysm, last imaging negative for recurrent or new aneurysm  Migraine prevention:  agree with Ajovy every 28 days Migraine rescue:  Triptans are contraindicated due to its vasoconstrictive properties in patient with history of cerebral aneurysm.  Will have her try samples of Ubrelvy and Nurtec.  She will let me know if she would like a prescription for one of them Zofran for nausea Limit use of pain relievers to no more than 2 days out of week to prevent risk of rebound or medication-overuse headache. Keep headache diary As repeat brain imaging stable after over 10 years, I don't think we need to routinely repeat imaging for another 7 years. Follow up 6 months.    Subjective:  Melanie Case is a 55 year old right-handed female with HTN, ADHD, chronic low back pain, fibromyalgia, HLD, sleep apnea and history of cerebral aneurysm s/p coiling who presents for headaches.  History supplemented by referring provider's note.  History of migraines since her 15s.  Typical migraines are severe bifrontal/back of head (but may be diffuse) pounding/stabbing pain.  They are associated nausea, photophobia, phonophobia, and possible blurred vision.  Typically last a day but sometimes into next day.  However, knocks out quickly with hydrocodone and ibuprofen.  Occur 3 to 4 a month.  Bright light, strenuous activity and change in weather are triggers.    She has history of cerebral aneurysm s/p coiling in 2004.  Presented as more severe headache with vomiting in association with nausea.  Most recent imaging was an MRI of brain without contrast on 09/04/2017, which was personally reviewed, and demonstrated  left MCA coiling stable compared to prior studies, as well as small hyperintense focus in left occipital parietal white matter, stable from 2009.  Current NSAIDS/analgesics:  Motrin, Norco Current triptans:  none Current ergotamine:  none Current anti-emetic:  none Current muscle relaxants:  none Current Antihypertensive medications:  atenolol, losartan Current Antidepressant medications:  Pristiq, Wellbutrin Current Anticonvulsant medications:  topiramate 100mg  BID Current anti-CGRP:  Recently prescribed Ajovy but hasn't started it yet.  Wanted to wait to see me first. Current Vitamins/Herbal/Supplements:  none Current Antihistamines/Decongestants:  loratidine Other therapy:  none Hormone/birth control:  none Other medications:  Klonopin TID PRN, Adderall  Past NSAIDS/analgesics:  Excedrin, Tyenol Past abortive triptans:  none Past abortive ergotamine:  none Past muscle relaxants:  none Past anti-emetic:  none Past antihypertensive medications:  none Past antidepressant medications:  amitriptyline, sertraline Past anticonvulsant medications:  none Past anti-CGRP:  Emgality Past vitamins/Herbal/Supplements:  none Past antihistamines/decongestants:  none Other past therapies:  none  Caffeine:  No coffee.  Drinks decaf tea.   Diet:  Nashla ale, needs to increase water intake.   Exercise:  no due to pain Depression:  yes; Anxiety:  yes Other pain:  Chronic low back pain, fibromyalgia.  On chronic opioid use. Sleep hygiene:  OSA.      PAST MEDICAL HISTORY: Past Medical History:  Diagnosis Date   ADHD (attention deficit hyperactivity disorder)    Depression    Fibromyalgia    GERD (gastroesophageal reflux disease)    Hyperlipidemia    Hypertension    Long term (current) use of opiate analgesic    Metabolic syndrome    Migraines  Obesity    Sleep apnea     PAST SURGICAL HISTORY: Past Surgical History:  Procedure Laterality Date   Left MCA aneurysm coiling  2003     MEDICATIONS: Current Outpatient Medications on File Prior to Visit  Medication Sig Dispense Refill   amphetamine-dextroamphetamine (ADDERALL) 20 MG tablet Take 20 mg by mouth 2 (two) times daily.     buPROPion (WELLBUTRIN XL) 150 MG 24 hr tablet TAKE 3 TABLETS BY MOUTH IN THE MORNING 90 tablet 0   clonazePAM (KLONOPIN) 0.5 MG tablet Take 0.5 mg by mouth 3 (three) times daily as needed for anxiety.     desvenlafaxine (PRISTIQ) 100 MG 24 hr tablet TAKE 1 TABLET BY MOUTH ONCE DAILY 30 tablet 0   HYDROcodone-acetaminophen (NORCO) 7.5-325 MG tablet Take 1 tablet by mouth every 6 (six) hours as needed for moderate pain.     ibuprofen (ADVIL,MOTRIN) 200 MG tablet Take 200 mg by mouth 2 (two) times daily.     loratadine (CLARITIN) 10 MG tablet Take 10 mg by mouth daily.     losartan (COZAAR) 25 MG tablet Take 2 tablets by mouth daily.     omeprazole (PRILOSEC) 40 MG capsule Take 40 mg by mouth daily.  4   simvastatin (ZOCOR) 40 MG tablet Take 40 mg by mouth daily.     topiramate (TOPAMAX) 100 MG tablet Take 100 mg by mouth 2 (two) times daily.     triamcinolone cream (KENALOG) 0.1 % Apply 1 application topically 3 (three) times daily.     No current facility-administered medications on file prior to visit.    ALLERGIES: No Known Allergies  FAMILY HISTORY: Family History  Problem Relation Age of Onset   Hypertension Brother    CAD Brother    Heart attack Mother    Kidney disease Mother     Objective:  Blood pressure (!) 142/84, pulse 77, height 5\' 5"  (1.651 m), weight 236 lb 9.6 oz (107.3 kg), last menstrual period 04/18/2017, SpO2 98 %. General: No acute distress.  Patient appears well-groomed.   Head:  Normocephalic/atraumatic Eyes:  fundi examined but not visualized Neck: supple, no paraspinal tenderness, full range of motion Back: No paraspinal tenderness Heart: regular rate and rhythm Lungs: Clear to auscultation bilaterally. Vascular: No carotid bruits. Neurological  Exam: Mental status: alert and oriented to person, place, and time, recent and remote memory intact, fund of knowledge intact, attention and concentration intact, speech fluent and not dysarthric, language intact. Cranial nerves: CN I: not tested CN II: pupils equal, round and reactive to light, visual fields intact CN III, IV, VI:  full range of motion, no nystagmus, no ptosis CN V: facial sensation intact. CN VII: upper and lower face symmetric CN VIII: hearing intact CN IX, X: gag intact, uvula midline CN XI: sternocleidomastoid and trapezius muscles intact CN XII: tongue midline Bulk & Tone: normal, no fasciculations. Motor:  muscle strength 5/5 throughout Sensation:  Pinprick, temperature and vibratory sensation intact. Deep Tendon Reflexes:  2+ throughout,  toes downgoing.   Finger to nose testing:  Without dysmetria.   Heel to shin:  Without dysmetria.   Gait:  Normal station and stride.  Romberg negative.    Thank you for allowing me to take part in the care of this patient.  13/06/2016, DO  CC: Day Shon Millet, MD

## 2020-12-27 ENCOUNTER — Ambulatory Visit: Payer: Medicare PPO | Admitting: Neurology

## 2020-12-27 ENCOUNTER — Other Ambulatory Visit: Payer: Self-pay

## 2020-12-27 ENCOUNTER — Encounter: Payer: Self-pay | Admitting: Neurology

## 2020-12-27 VITALS — BP 142/84 | HR 77 | Ht 65.0 in | Wt 236.6 lb

## 2020-12-27 DIAGNOSIS — Z8679 Personal history of other diseases of the circulatory system: Secondary | ICD-10-CM | POA: Diagnosis not present

## 2020-12-27 DIAGNOSIS — G43019 Migraine without aura, intractable, without status migrainosus: Secondary | ICD-10-CM | POA: Diagnosis not present

## 2020-12-27 MED ORDER — ONDANSETRON 4 MG PO TBDP: 4.0000 mg | ORAL_TABLET | Freq: Three times a day (TID) | ORAL | 5 refills | Status: AC | PRN

## 2020-12-27 NOTE — Patient Instructions (Signed)
  Start Ajovy every 28 days I will give you samples of two different rescue medications to try:  - Ubrelvy:  Take 1 tablet earliest onset of headache.  May repeat in 2 hours (maximum 2 tablets in 24 hours)  - Nurtec:  Take 1 tablet earliest onset of headache.  Maximum 1 tablet in 24 hours  Contact me for script of most effective.  Take ondansetron for nausea. Limit use of pain relievers to no more than 2 days out of the week.  These medications include acetaminophen, NSAIDs (ibuprofen/Advil/Motrin, naproxen/Aleve, triptans (Imitrex/sumatriptan), Excedrin, and narcotics.  This will help reduce risk of rebound headaches. Be aware of common food triggers:  - Caffeine:  coffee, black tea, cola, Mt. Dew  - Chocolate  - Dairy:  aged cheeses (brie, blue, cheddar, gouda, [place], provolone, [place], Swiss, etc), chocolate milk, buttermilk, sour cream, limit eggs and yogurt  - Nuts, peanut butter  - Alcohol  - Cereals/grains:  FRESH breads (fresh bagels, sourdough, doughnuts), yeast productions  - Processed/canned/aged/cured meats (pre-packaged deli meats, hotdogs)  - MSG/glutamate:  soy sauce, flavor enhancer, pickled/preserved/marinated foods  - Sweeteners:  aspartame (Equal, Nutrasweet).  Sugar and Splenda are okay  - Vegetables:  legumes (lima beans, lentils, snow peas, fava beans, pinto peans, peas, garbanzo beans), sauerkraut, onions, olives, pickles  - Fruit:  avocados, bananas, citrus fruit (orange, lemon, grapefruit), mango  - Other:  Frozen meals, macaroni and cheese Routine exercise Stay adequately hydrated (aim for 64 oz water daily) Keep headache diary Maintain proper stress management Maintain proper sleep hygiene Do not skip meals Consider supplements:  magnesium citrate 400mg  daily, riboflavin 400mg  daily, coenzyme Q10 100mg  three times daily.

## 2020-12-28 ENCOUNTER — Telehealth: Payer: Self-pay

## 2020-12-28 NOTE — Telephone Encounter (Signed)
Pt left a message this morning pleas send at least one Zofran to the [facility] in [place].  she will not be able to get one form [facility] for at least a week.

## 2020-12-29 MED ORDER — ONDANSETRON 4 MG PO TBDP: 4.0000 mg | ORAL_TABLET | Freq: Three times a day (TID) | ORAL | 0 refills | Status: DC | PRN

## 2020-12-29 NOTE — Telephone Encounter (Signed)
Zofran 4 mg sent to [facility] in [place] per pt request.

## 2021-01-13 ENCOUNTER — Ambulatory Visit (INDEPENDENT_AMBULATORY_CARE_PROVIDER_SITE_OTHER): Payer: Medicare PPO

## 2021-01-13 ENCOUNTER — Ambulatory Visit: Payer: Medicare PPO | Admitting: Podiatry

## 2021-01-13 ENCOUNTER — Other Ambulatory Visit: Payer: Self-pay

## 2021-01-13 ENCOUNTER — Encounter: Payer: Self-pay | Admitting: Podiatry

## 2021-01-13 DIAGNOSIS — M722 Plantar fascial fibromatosis: Secondary | ICD-10-CM | POA: Diagnosis not present

## 2021-01-13 MED ORDER — TRIAMCINOLONE ACETONIDE 10 MG/ML IJ SUSP
10.0000 mg | Freq: Once | INTRAMUSCULAR | Status: AC
  Administered 2021-01-13: 10 mg

## 2021-01-13 NOTE — Progress Notes (Signed)
Subjective:   Patient ID: Melanie Case, female   DOB: 55 y.o.   MRN: [id number]   HPI Patient presents with exquisite discomfort plantar aspect right heel of approximate 2 months duration.  Does not remember injury states its been really bothering her and hard to walk with.  Patient does not smoke likes to be active moderately obese   Review of Systems  All other systems reviewed and are negative.      Objective:  Physical Exam Vitals and nursing note reviewed.  Constitutional:      Appearance: She is well-developed.  Pulmonary:     Effort: Pulmonary effort is normal.  Musculoskeletal:        General: Normal range of motion.  Skin:    General: Skin is warm.  Neurological:     Mental Status: She is alert.    Neurovascular status intact muscle strength adequate range of motion within normal limits with exquisite discomfort plantar aspect right heel at the insertional point of the tendon into the calcaneus with inflammation fluid around the medial band.  Patient has moderate obesity and flatfoot deformity with good digital perfusion     Assessment:  Acute Planter fasciitis right inflammation fluid with moderate structural changes of the arch     Plan:  H&P reviewed condition went ahead did sterile prep injected the medial fascial band 3 mg Kenalog 5 mg Xylocaine and applied fascial brace to lift up the arch.  Gave instructions on supportive shoe gear and stretching reappoint to recheck  X-rays indicate there is small spur no indication stress fracture arthritis

## 2021-01-13 NOTE — Progress Notes (Signed)
d 

## 2021-01-13 NOTE — Patient Instructions (Signed)

## 2021-01-23 DIAGNOSIS — M5416 Radiculopathy, lumbar region: Secondary | ICD-10-CM | POA: Diagnosis not present

## 2021-01-23 DIAGNOSIS — M47896 Other spondylosis, lumbar region: Secondary | ICD-10-CM | POA: Diagnosis not present

## 2021-01-23 DIAGNOSIS — G894 Chronic pain syndrome: Secondary | ICD-10-CM | POA: Diagnosis not present

## 2021-01-23 DIAGNOSIS — M5136 Other intervertebral disc degeneration, lumbar region: Secondary | ICD-10-CM | POA: Diagnosis not present

## 2021-01-23 DIAGNOSIS — E669 Obesity, unspecified: Secondary | ICD-10-CM | POA: Diagnosis not present

## 2021-01-23 DIAGNOSIS — G43711 Chronic migraine without aura, intractable, with status migrainosus: Secondary | ICD-10-CM | POA: Diagnosis not present

## 2021-01-27 ENCOUNTER — Encounter: Payer: Self-pay | Admitting: Podiatry

## 2021-01-27 ENCOUNTER — Ambulatory Visit: Payer: Medicare PPO | Admitting: Podiatry

## 2021-01-27 ENCOUNTER — Other Ambulatory Visit: Payer: Self-pay

## 2021-01-27 DIAGNOSIS — M722 Plantar fascial fibromatosis: Secondary | ICD-10-CM | POA: Diagnosis not present

## 2021-01-27 MED ORDER — TRIAMCINOLONE ACETONIDE 10 MG/ML IJ SUSP
10.0000 mg | Freq: Once | INTRAMUSCULAR | Status: AC
  Administered 2021-01-27: 10 mg

## 2021-01-30 NOTE — Progress Notes (Signed)
Subjective:   Patient ID: Melanie Case, female   DOB: 55 y.o.   MRN: [id number]   HPI Patient presents stating that the pain in her right heel remains quite intense.  Worse when she gets up in the morning and after periods of sitting and does not feel like it is being stretched properly   ROS      Objective:  Physical Exam  Neurovascular status intact with acute discomfort right plantar fascial at the insertion tendon calcaneus     Assessment:  Cute Planter fasciitis right inflammation fluid buildup     Plan:  H&P reviewed condition sterile prep and injected the plantar fascial right 3 mg Kenalog 5 mg Xylocaine gave instructions for physical therapy support and reappoint to recheck.  I did dispense a second night splint as I think this will help this condition and I have advised her on the 2 usage

## 2021-02-03 DIAGNOSIS — R4582 Worries: Secondary | ICD-10-CM | POA: Diagnosis not present

## 2021-02-03 DIAGNOSIS — J019 Acute sinusitis, unspecified: Secondary | ICD-10-CM | POA: Diagnosis not present

## 2021-02-03 DIAGNOSIS — M797 Fibromyalgia: Secondary | ICD-10-CM | POA: Diagnosis not present

## 2021-02-03 DIAGNOSIS — E8881 Metabolic syndrome: Secondary | ICD-10-CM | POA: Diagnosis not present

## 2021-02-03 DIAGNOSIS — K2100 Gastro-esophageal reflux disease with esophagitis, without bleeding: Secondary | ICD-10-CM | POA: Diagnosis not present

## 2021-02-03 DIAGNOSIS — I10 Essential (primary) hypertension: Secondary | ICD-10-CM | POA: Diagnosis not present

## 2021-02-03 DIAGNOSIS — F900 Attention-deficit hyperactivity disorder, predominantly inattentive type: Secondary | ICD-10-CM | POA: Diagnosis not present

## 2021-02-03 DIAGNOSIS — G43919 Migraine, unspecified, intractable, without status migrainosus: Secondary | ICD-10-CM | POA: Diagnosis not present

## 2021-02-24 DIAGNOSIS — M5416 Radiculopathy, lumbar region: Secondary | ICD-10-CM | POA: Diagnosis not present

## 2021-02-24 DIAGNOSIS — M5459 Other low back pain: Secondary | ICD-10-CM | POA: Diagnosis not present

## 2021-02-27 DIAGNOSIS — H40053 Ocular hypertension, bilateral: Secondary | ICD-10-CM | POA: Diagnosis not present

## 2021-02-27 DIAGNOSIS — H35033 Hypertensive retinopathy, bilateral: Secondary | ICD-10-CM | POA: Diagnosis not present

## 2021-03-02 DIAGNOSIS — R946 Abnormal results of thyroid function studies: Secondary | ICD-10-CM | POA: Diagnosis not present

## 2021-03-02 DIAGNOSIS — Z1329 Encounter for screening for other suspected endocrine disorder: Secondary | ICD-10-CM | POA: Diagnosis not present

## 2021-03-02 DIAGNOSIS — D519 Vitamin B12 deficiency anemia, unspecified: Secondary | ICD-10-CM | POA: Diagnosis not present

## 2021-03-03 DIAGNOSIS — E538 Deficiency of other specified B group vitamins: Secondary | ICD-10-CM | POA: Diagnosis not present

## 2021-03-11 DIAGNOSIS — E538 Deficiency of other specified B group vitamins: Secondary | ICD-10-CM | POA: Diagnosis not present

## 2021-03-14 DIAGNOSIS — E559 Vitamin D deficiency, unspecified: Secondary | ICD-10-CM | POA: Diagnosis not present

## 2021-03-14 DIAGNOSIS — L570 Actinic keratosis: Secondary | ICD-10-CM | POA: Diagnosis not present

## 2021-03-14 DIAGNOSIS — E8881 Metabolic syndrome: Secondary | ICD-10-CM | POA: Diagnosis not present

## 2021-03-14 DIAGNOSIS — M797 Fibromyalgia: Secondary | ICD-10-CM | POA: Diagnosis not present

## 2021-03-14 DIAGNOSIS — E7849 Other hyperlipidemia: Secondary | ICD-10-CM | POA: Diagnosis not present

## 2021-03-14 DIAGNOSIS — F900 Attention-deficit hyperactivity disorder, predominantly inattentive type: Secondary | ICD-10-CM | POA: Diagnosis not present

## 2021-03-14 DIAGNOSIS — R7301 Impaired fasting glucose: Secondary | ICD-10-CM | POA: Diagnosis not present

## 2021-03-14 DIAGNOSIS — E782 Mixed hyperlipidemia: Secondary | ICD-10-CM | POA: Diagnosis not present

## 2021-03-14 DIAGNOSIS — I10 Essential (primary) hypertension: Secondary | ICD-10-CM | POA: Diagnosis not present

## 2021-03-14 DIAGNOSIS — K5732 Diverticulitis of large intestine without perforation or abscess without bleeding: Secondary | ICD-10-CM | POA: Diagnosis not present

## 2021-03-14 DIAGNOSIS — K219 Gastro-esophageal reflux disease without esophagitis: Secondary | ICD-10-CM | POA: Diagnosis not present

## 2021-03-17 DIAGNOSIS — G43919 Migraine, unspecified, intractable, without status migrainosus: Secondary | ICD-10-CM | POA: Diagnosis not present

## 2021-03-17 DIAGNOSIS — Z23 Encounter for immunization: Secondary | ICD-10-CM | POA: Diagnosis not present

## 2021-03-17 DIAGNOSIS — M797 Fibromyalgia: Secondary | ICD-10-CM | POA: Diagnosis not present

## 2021-03-17 DIAGNOSIS — E8881 Metabolic syndrome: Secondary | ICD-10-CM | POA: Diagnosis not present

## 2021-03-17 DIAGNOSIS — F900 Attention-deficit hyperactivity disorder, predominantly inattentive type: Secondary | ICD-10-CM | POA: Diagnosis not present

## 2021-03-17 DIAGNOSIS — I10 Essential (primary) hypertension: Secondary | ICD-10-CM | POA: Diagnosis not present

## 2021-03-17 DIAGNOSIS — D519 Vitamin B12 deficiency anemia, unspecified: Secondary | ICD-10-CM | POA: Diagnosis not present

## 2021-03-17 DIAGNOSIS — F439 Reaction to severe stress, unspecified: Secondary | ICD-10-CM | POA: Diagnosis not present

## 2021-03-22 ENCOUNTER — Ambulatory Visit: Payer: Medicare PPO | Admitting: Psychiatry

## 2021-03-22 ENCOUNTER — Encounter: Payer: Self-pay | Admitting: Psychiatry

## 2021-03-22 ENCOUNTER — Other Ambulatory Visit: Payer: Self-pay

## 2021-03-22 VITALS — BP 150/102 | HR 63

## 2021-03-22 DIAGNOSIS — F39 Unspecified mood [affective] disorder: Secondary | ICD-10-CM

## 2021-03-22 MED ORDER — ARIPIPRAZOLE 5 MG PO TABS: 5.0000 mg | ORAL_TABLET | Freq: Every day | ORAL | 1 refills | Status: DC

## 2021-03-22 NOTE — Progress Notes (Signed)
DAJA SHUPING [id number] [birth year] 55 y.o.  Subjective:   Patient ID:  Melanie Case is a 55 y.o. (DOB 05/17/1966) female.  Chief Complaint:  Chief Complaint  Patient presents with   Follow-up   Depression   Stress   Anxiety   Family Problem    HPI Melanie Case presents to the office today for follow-up of TRD. 04/2018 appt noted; Very sensitive emotionally and always has been that way.  Sister moved back and is stressful to deal with. Trying to do more projects in the house.  First time in 5 years she'll have company for the holidays DT depression. Pt reports that mood is Anxious and stressed with sister.  and describes anxiety as Moderate. Anxiety symptoms include: Excessive Worry,. Pt reports no sleep issues. Pt reports that appetite is good. Pt reports that energy is good and good. Concentration is good. Suicidal thoughts:  denied by patient.  Energy fair DT FM.  Slow in am.  Has had anxiety about driving and just started driving around town, not here. Excited about the holidays for the first time in year.s. She does not want changes. On Adderall off and on from PCP for focus.  Not sure when she first started stimulants for focus, but for years when she was working DT distractibility. Plan no med changes  03/22/2021 appointment with the following noted: As noted the patient has not been seen in 3 years. 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 D 20 lives with her.  Going on for about 3 years. Gets angry and about to explode.  Constantly having to worry about her.   Hesitates to take clonazepam 0.5 bc makes her tired.  Only takes at night.  Doesn't want to go anywhere.  Hx winter depression.  Let herself gain weight and doesn't want anyone to see her.  Not using makeup and ashamed of herself.  Gained weight. Adderall doesn't stimulate her but does focus her.   Review of Systems:  Review of Systems  Gastrointestinal:  Positive for abdominal pain.   Musculoskeletal:  Positive for arthralgias.  Neurological:  Negative for tremors and weakness.  Psychiatric/Behavioral:  Negative for agitation, behavioral problems, confusion, decreased concentration, dysphoric mood, hallucinations, self-injury, sleep disturbance and suicidal ideas. The patient is not nervous/anxious and is not hyperactive.    Medications: I have reviewed the patient's current medications.  Current Outpatient Medications  Medication Sig Dispense Refill   amphetamine-dextroamphetamine (ADDERALL) 20 MG tablet Take 20 mg by mouth 2 (two) times daily.     atenolol (TENORMIN) 50 MG tablet Take 50 mg by mouth daily.     buPROPion (WELLBUTRIN XL) 150 MG 24 hr tablet TAKE 3 TABLETS BY MOUTH IN THE MORNING 90 tablet 0   clonazePAM (KLONOPIN) 0.5 MG tablet Take 0.5 mg by mouth 3 (three) times daily as needed for anxiety.     desvenlafaxine (PRISTIQ) 100 MG 24 hr tablet TAKE 1 TABLET BY MOUTH ONCE DAILY 30 tablet 0   HYDROcodone-acetaminophen (NORCO) 7.5-325 MG tablet Take 1 tablet by mouth every 6 (six) hours as needed for moderate pain.     ibuprofen (ADVIL,MOTRIN) 200 MG tablet Take 200 mg by mouth 2 (two) times daily.     loratadine (CLARITIN) 10 MG tablet Take 10 mg by mouth daily.     losartan (COZAAR) 25 MG tablet Take 2 tablets by mouth daily.     omeprazole (PRILOSEC) 40 MG capsule Take 40 mg by mouth daily.  4  ondansetron (ZOFRAN ODT) 4 MG disintegrating tablet Take 1 tablet (4 mg total) by mouth every 8 (eight) hours as needed for nausea or vomiting. 20 tablet 5   triamcinolone cream (KENALOG) 0.1 % Apply 1 application topically 3 (three) times daily.     simvastatin (ZOCOR) 40 MG tablet Take 40 mg by mouth daily. (Patient not taking: No sig reported)     topiramate (TOPAMAX) 100 MG tablet Take 100 mg by mouth 2 (two) times daily. (Patient not taking: Reported on 03/22/2021)     No current facility-administered medications for this visit.    Medication Side Effects:  None  Allergies: No Known Allergies  Past Medical History:  Diagnosis Date   ADHD (attention deficit hyperactivity disorder)    Depression    Fibromyalgia    GERD (gastroesophageal reflux disease)    Hyperlipidemia    Hypertension    Long term (current) use of opiate analgesic    Metabolic syndrome    Migraines    Obesity    Sleep apnea     Family History  Problem Relation Age of Onset   Hypertension Brother    CAD Brother    Heart attack Mother    Kidney disease Mother     Social History   Socioeconomic History   Marital status: Married    Spouse name: Not on file   Number of children: Not on file   Years of education: Not on file   Highest education level: Not on file  Occupational History   Not on file  Tobacco Use   Smoking status: Former    Types: Cigarettes    Quit date: 06/18/2001    Years since quitting: 19.7   Smokeless tobacco: Never  Substance and Sexual Activity   Alcohol use: Yes    Comment: occasional   Drug use: Never   Sexual activity: Not on file  Other Topics Concern   Not on file  Social History Narrative   Not on file   Social Determinants of Health   Financial Resource Strain: Not on file  Food Insecurity: Not on file  Transportation Needs: Not on file  Physical Activity: Not on file  Stress: Not on file  Social Connections: Not on file  Intimate Partner Violence: Not on file    Past Medical History, Surgical history, Social history, and Family history were reviewed and updated as appropriate.   Recent normal card stress test and colonoscopy.  Please see review of systems for further details on the patient's review from today.   Objective:   Physical Exam:  BP (!) 150/102   Pulse 63   LMP 04/18/2017 (Approximate)   Physical Exam Constitutional:      General: She is not in acute distress.    Appearance: She is well-developed.  Musculoskeletal:        General: No deformity.  Neurological:     Mental Status: She is  alert and oriented to person, place, and time.     Motor: No tremor.     Coordination: Coordination normal.     Gait: Gait normal.  Psychiatric:        Attention and Perception: Perception normal.        Mood and Affect: Mood is anxious and depressed. Affect is tearful. Affect is not labile, blunt, angry or inappropriate.        Speech: Speech normal.        Behavior: Behavior normal.        Thought Content: Thought  content normal. Thought content does not include homicidal or suicidal ideation. Thought content does not include homicidal or suicidal plan.        Cognition and Memory: Cognition and memory normal.        Judgment: Judgment normal.     Comments: Insight intact. No auditory or visual hallucinations. No delusions.      Lab Review:     Component Value Date/Time   BUN 11 10/04/2010 1620   CREATININE 0.85 10/04/2010 1620   GFRNONAA >60 10/04/2010 1620   GFRAA  10/04/2010 1620    >60        The eGFR has been calculated using the MDRD equation. This calculation has not been validated in all clinical situations. eGFR's persistently <60 mL/min signify possible Chronic Kidney Disease.    No results found for: WBC, RBC, HGB, HCT, PLT, MCV, MCH, MCHC, RDW, LYMPHSABS, MONOABS, EOSABS, BASOSABS  No results found for: POCLITH, LITHIUM   No results found for: PHENYTOIN, PHENOBARB, VALPROATE, CBMZ   .res Assessment: Plan:    Episodic mood disorder ([place])  Greater than 50% of 30 min face to face time with patient was spent on counseling and coordination of care. We discussed hx TRD.    Needs to attend [facility] for family. Needs therapy for dealing with D's addiction.    Disc the risks involved using uppers and downers together.  Also sz risk with the combo of wellbutrin and stimulants.  She seems to be benefitting and tolerating.    Disc progressing in dealing with driving phobias.  Mounjaro or Ozempic for weight loss [facility]  To help  anger and depression and   Start Abilify 5 mg daily Reduce the Wellbutrin to 2 tablets daily for 2 weeks, Then reduce Wellbutrin to 1 daily for 2 weeks, Then stop Wellbutrin  30 min  FU 2 mos  .Lynder Parents, Md, DFAPA   Future Appointments  Date Time Provider [place]  07/07/2021 10:50 AM Pieter Partridge, DO [facility] None    No orders of the defined types were placed in this encounter.     -------------------------------

## 2021-03-22 NOTE — Patient Instructions (Addendum)
Mounjaro or Ozempic for weight loss [facility]  Start Abilify 1 daily Reduce the Wellbutrin to 2 tablets daily for 2 weeks, Then reduce Wellbutrin to 1 daily for 2 weeks, Then stop Wellbutrin

## 2021-04-07 DIAGNOSIS — K2100 Gastro-esophageal reflux disease with esophagitis, without bleeding: Secondary | ICD-10-CM | POA: Diagnosis not present

## 2021-04-07 DIAGNOSIS — G43919 Migraine, unspecified, intractable, without status migrainosus: Secondary | ICD-10-CM | POA: Diagnosis not present

## 2021-04-07 DIAGNOSIS — M797 Fibromyalgia: Secondary | ICD-10-CM | POA: Diagnosis not present

## 2021-04-07 DIAGNOSIS — F900 Attention-deficit hyperactivity disorder, predominantly inattentive type: Secondary | ICD-10-CM | POA: Diagnosis not present

## 2021-04-07 DIAGNOSIS — I10 Essential (primary) hypertension: Secondary | ICD-10-CM | POA: Diagnosis not present

## 2021-04-07 DIAGNOSIS — D519 Vitamin B12 deficiency anemia, unspecified: Secondary | ICD-10-CM | POA: Diagnosis not present

## 2021-04-07 DIAGNOSIS — E8881 Metabolic syndrome: Secondary | ICD-10-CM | POA: Diagnosis not present

## 2021-04-07 DIAGNOSIS — R7303 Prediabetes: Secondary | ICD-10-CM | POA: Diagnosis not present

## 2021-04-15 DIAGNOSIS — Z6841 Body Mass Index (BMI) 40.0 and over, adult: Secondary | ICD-10-CM | POA: Diagnosis not present

## 2021-04-15 DIAGNOSIS — M79605 Pain in left leg: Secondary | ICD-10-CM | POA: Diagnosis not present

## 2021-04-20 DIAGNOSIS — R682 Dry mouth, unspecified: Secondary | ICD-10-CM | POA: Diagnosis not present

## 2021-04-20 DIAGNOSIS — L298 Other pruritus: Secondary | ICD-10-CM | POA: Diagnosis not present

## 2021-04-20 DIAGNOSIS — K1379 Other lesions of oral mucosa: Secondary | ICD-10-CM | POA: Diagnosis not present

## 2021-05-03 DIAGNOSIS — M47896 Other spondylosis, lumbar region: Secondary | ICD-10-CM | POA: Diagnosis not present

## 2021-05-03 DIAGNOSIS — G894 Chronic pain syndrome: Secondary | ICD-10-CM | POA: Diagnosis not present

## 2021-05-03 DIAGNOSIS — M545 Low back pain, unspecified: Secondary | ICD-10-CM | POA: Diagnosis not present

## 2021-05-03 DIAGNOSIS — M5416 Radiculopathy, lumbar region: Secondary | ICD-10-CM | POA: Diagnosis not present

## 2021-05-03 DIAGNOSIS — E669 Obesity, unspecified: Secondary | ICD-10-CM | POA: Diagnosis not present

## 2021-05-03 DIAGNOSIS — M5136 Other intervertebral disc degeneration, lumbar region: Secondary | ICD-10-CM | POA: Diagnosis not present

## 2021-05-03 DIAGNOSIS — G43711 Chronic migraine without aura, intractable, with status migrainosus: Secondary | ICD-10-CM | POA: Diagnosis not present

## 2021-05-05 DIAGNOSIS — E538 Deficiency of other specified B group vitamins: Secondary | ICD-10-CM | POA: Diagnosis not present

## 2021-05-17 DIAGNOSIS — G43711 Chronic migraine without aura, intractable, with status migrainosus: Secondary | ICD-10-CM | POA: Diagnosis not present

## 2021-05-17 DIAGNOSIS — M130 Polyarthritis, unspecified: Secondary | ICD-10-CM | POA: Diagnosis not present

## 2021-05-17 DIAGNOSIS — M47896 Other spondylosis, lumbar region: Secondary | ICD-10-CM | POA: Diagnosis not present

## 2021-05-17 DIAGNOSIS — G894 Chronic pain syndrome: Secondary | ICD-10-CM | POA: Diagnosis not present

## 2021-05-17 DIAGNOSIS — M545 Low back pain, unspecified: Secondary | ICD-10-CM | POA: Diagnosis not present

## 2021-05-17 DIAGNOSIS — E669 Obesity, unspecified: Secondary | ICD-10-CM | POA: Diagnosis not present

## 2021-05-17 DIAGNOSIS — M5416 Radiculopathy, lumbar region: Secondary | ICD-10-CM | POA: Diagnosis not present

## 2021-05-17 DIAGNOSIS — M5136 Other intervertebral disc degeneration, lumbar region: Secondary | ICD-10-CM | POA: Diagnosis not present

## 2021-05-23 DIAGNOSIS — R300 Dysuria: Secondary | ICD-10-CM | POA: Diagnosis not present

## 2021-05-24 ENCOUNTER — Other Ambulatory Visit: Payer: Self-pay | Admitting: Psychiatry

## 2021-05-24 DIAGNOSIS — F39 Unspecified mood [affective] disorder: Secondary | ICD-10-CM

## 2021-06-01 ENCOUNTER — Other Ambulatory Visit: Payer: Self-pay

## 2021-06-01 ENCOUNTER — Ambulatory Visit: Payer: Medicare PPO | Admitting: Psychiatry

## 2021-06-01 ENCOUNTER — Encounter: Payer: Self-pay | Admitting: Psychiatry

## 2021-06-01 VITALS — BP 116/76 | HR 73

## 2021-06-01 DIAGNOSIS — F331 Major depressive disorder, recurrent, moderate: Secondary | ICD-10-CM | POA: Diagnosis not present

## 2021-06-01 DIAGNOSIS — F39 Unspecified mood [affective] disorder: Secondary | ICD-10-CM

## 2021-06-01 MED ORDER — ARIPIPRAZOLE 5 MG PO TABS: 5.0000 mg | ORAL_TABLET | Freq: Every day | ORAL | 0 refills | Status: DC

## 2021-06-01 MED ORDER — DESVENLAFAXINE SUCCINATE ER 100 MG PO TB24: 100.0000 mg | ORAL_TABLET | Freq: Every day | ORAL | 0 refills | Status: DC

## 2021-06-01 NOTE — Patient Instructions (Signed)
Constipation management 1.  Lots of water 2.  Powdered fiber supplement such as MiraLAX, Citrucel, etc. preferably with a meal 3.  2 stool softeners a day 4.  Milk of magnesia or magnesium tablets 2 daily if needed

## 2021-06-01 NOTE — Progress Notes (Signed)
[place] [id number] [birth year] 55 y.o.  Subjective:   Patient ID:  Melanie Case is a 55 y.o. (DOB 12-10-1965) female.  Chief Complaint:  Chief Complaint  Patient presents with   Follow-up    Episodic mood disorder ([place])   Depression    HPI Melanie Case presents to the office today for follow-up of TRD. 04/2018 appt noted; Very sensitive emotionally and always has been that way.  Sister moved back and is stressful to deal with. Trying to do more projects in the house.  First time in 5 years she'll have company for the holidays DT depression. Pt reports that mood is Anxious and stressed with sister.  and describes anxiety as Moderate. Anxiety symptoms include: Excessive Worry,. Pt reports no sleep issues. Pt reports that appetite is good. Pt reports that energy is good and good. Concentration is good. Suicidal thoughts:  denied by patient.  Energy fair DT FM.  Slow in am.  Has had anxiety about driving and just started driving around town, not here. Excited about the holidays for the first time in year.s. She does not want changes. On Adderall off and on from PCP for focus.  Not sure when she first started stimulants for focus, but for years when she was working DT distractibility. Plan no med changes  03/22/2021 appointment with the following noted: As noted the patient has not been seen in 3 years. 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 D 5 lives with her.  Going on for about 3 years. Gets angry and about to explode.  Constantly having to worry about her.   Hesitates to take clonazepam 0.5 bc makes her tired.  Only takes at night.  Doesn't want to go anywhere.  Hx winter depression.  Let herself gain weight and doesn't want anyone to see her.  Not using makeup and ashamed of herself.  Gained weight. Adderall doesn't stimulate her but does focus her. Plan: To help anger and depression and  Start Abilify 5 mg daily Reduce the Wellbutrin to 2 tablets daily for 2  weeks, Then reduce Wellbutrin to 1 daily for 2 weeks, Then stop Wellbutrin Continue Pristiq 100  06/01/2021 appointment with the following noted: Out of a fog now.  Much more awake.  Still not a lot of interest in getting up and going out. Needs more motivation to go out in public.  No crying.  No anger outbursts or irritable.  Everybody can tell a difference in same way.   Ozempic started by constipation Plans to start water aerobics Less depression but still feels bad about her weight.  Past Psychiatric Medication Trials:  Wellbutrin Pristiq Abilify 5  Review of Systems:  Review of Systems  Gastrointestinal:  Positive for abdominal pain and constipation.  Musculoskeletal:  Positive for arthralgias.  Neurological:  Negative for tremors and weakness.  Psychiatric/Behavioral:  Negative for agitation, behavioral problems, confusion, decreased concentration, dysphoric mood, hallucinations, self-injury, sleep disturbance and suicidal ideas. The patient is not nervous/anxious and is not hyperactive.    Medications: I have reviewed the patient's current medications.  Current Outpatient Medications  Medication Sig Dispense Refill   amphetamine-dextroamphetamine (ADDERALL) 20 MG tablet Take 20 mg by mouth 2 (two) times daily.     atenolol (TENORMIN) 50 MG tablet Take 50 mg by mouth daily.     clonazePAM (KLONOPIN) 0.5 MG tablet Take 0.5 mg by mouth 3 (three) times daily as needed for anxiety.     ibuprofen (ADVIL,MOTRIN) 200 MG  tablet Take 200 mg by mouth 2 (two) times daily.     loratadine (CLARITIN) 10 MG tablet Take 10 mg by mouth daily.     losartan (COZAAR) 25 MG tablet Take 2 tablets by mouth daily.     omeprazole (PRILOSEC) 40 MG capsule Take 40 mg by mouth daily.  4   ondansetron (ZOFRAN ODT) 4 MG disintegrating tablet Take 1 tablet (4 mg total) by mouth every 8 (eight) hours as needed for nausea or vomiting. 20 tablet 5   topiramate (TOPAMAX) 100 MG tablet Take 100 mg by mouth 2  (two) times daily.     triamcinolone cream (KENALOG) 0.1 % Apply 1 application topically 3 (three) times daily.     ARIPiprazole (ABILIFY) 5 MG tablet Take 1 tablet (5 mg total) by mouth daily. 90 tablet 0   desvenlafaxine (PRISTIQ) 100 MG 24 hr tablet Take 1 tablet (100 mg total) by mouth daily. 90 tablet 0   HYDROcodone-acetaminophen (NORCO) 7.5-325 MG tablet Take 1 tablet by mouth every 6 (six) hours as needed for moderate pain. (Patient not taking: Reported on 06/01/2021)     simvastatin (ZOCOR) 40 MG tablet Take 40 mg by mouth daily. (Patient not taking: Reported on 06/01/2021)     No current facility-administered medications for this visit.    Medication Side Effects: None  Allergies: No Known Allergies  Past Medical History:  Diagnosis Date   ADHD (attention deficit hyperactivity disorder)    Depression    Fibromyalgia    GERD (gastroesophageal reflux disease)    Hyperlipidemia    Hypertension    Long term (current) use of opiate analgesic    Metabolic syndrome    Migraines    Obesity    Sleep apnea     Family History  Problem Relation Age of Onset   Hypertension Brother    CAD Brother    Heart attack Mother    Kidney disease Mother     Social History   Socioeconomic History   Marital status: Married    Spouse name: Not on file   Number of children: Not on file   Years of education: Not on file   Highest education level: Not on file  Occupational History   Not on file  Tobacco Use   Smoking status: Former    Types: Cigarettes    Quit date: 06/18/2001    Years since quitting: 19.9   Smokeless tobacco: Never  Substance and Sexual Activity   Alcohol use: Yes    Comment: occasional   Drug use: Never   Sexual activity: Not on file  Other Topics Concern   Not on file  Social History Narrative   Not on file   Social Determinants of Health   Financial Resource Strain: Not on file  Food Insecurity: Not on file  Transportation Needs: Not on file   Physical Activity: Not on file  Stress: Not on file  Social Connections: Not on file  Intimate Partner Violence: Not on file    Past Medical History, Surgical history, Social history, and Family history were reviewed and updated as appropriate.   Recent normal card stress test and colonoscopy.  Please see review of systems for further details on the patient's review from today.   Objective:   Physical Exam:  BP 116/76    Pulse 73    LMP 04/18/2017 (Approximate)   Physical Exam Constitutional:      General: She is not in acute distress.    Appearance: She  is well-developed.  Musculoskeletal:        General: No deformity.  Neurological:     Mental Status: She is alert and oriented to person, place, and time.     Motor: No tremor.     Coordination: Coordination normal.     Gait: Gait normal.  Psychiatric:        Attention and Perception: Perception normal.        Mood and Affect: Mood is anxious and depressed. Affect is not labile, blunt, angry, tearful or inappropriate.        Speech: Speech normal.        Behavior: Behavior normal.        Thought Content: Thought content normal. Thought content is not delusional. Thought content does not include homicidal or suicidal ideation.        Cognition and Memory: Cognition and memory normal.        Judgment: Judgment normal.     Comments: Insight intact. No auditory or visual hallucinations. No delusions.      Lab Review:     Component Value Date/Time   BUN 11 10/04/2010 1620   CREATININE 0.85 10/04/2010 1620   GFRNONAA >60 10/04/2010 1620   GFRAA  10/04/2010 1620    >60        The eGFR has been calculated using the MDRD equation. This calculation has not been validated in all clinical situations. eGFR's persistently <60 mL/min signify possible Chronic Kidney Disease.    No results found for: WBC, RBC, HGB, HCT, PLT, MCV, MCH, MCHC, RDW, LYMPHSABS, MONOABS, EOSABS, BASOSABS  No results found for: POCLITH, LITHIUM    No results found for: PHENYTOIN, PHENOBARB, VALPROATE, CBMZ   .res Assessment: Plan:    Episodic mood disorder (HCC) - Plan: ARIPiprazole (ABILIFY) 5 MG tablet, desvenlafaxine (PRISTIQ) 100 MG 24 hr tablet  Major depressive disorder, recurrent episode, moderate (HCC) - Plan: ARIPiprazole (ABILIFY) 5 MG tablet, desvenlafaxine (PRISTIQ) 100 MG 24 hr tablet  Greater than 50% of 30 min face to face time with patient was spent on counseling and coordination of care. We discussed hx TRD.    Needs to attend [facility] for family. Needs therapy for dealing with D's addiction.    Disc the risks involved using uppers and downers together.  Also sz risk with the combo of wellbutrin and stimulants.  She seems to be benefitting and tolerating.    Disc progressing in dealing with driving phobias.  Mounjaro or Ozempic for weight loss [facility] Constipation management 1.  Lots of water 2.  Powdered fiber supplement such as MiraLAX, Citrucel, etc. preferably with a meal 3.  2 stool softeners a day 4.  Milk of magnesia or magnesium tablets if needed  To help anger and depression and  Abilify 5 mg daily clearly helpful Continue Pristiq 100 mg daily Continue Adderall 20 mg BID  FU 2 mos  .Lynder Parents, Md, DFAPA   Future Appointments  Date Time Provider [place]  08/16/2021  1:50 PM Pieter Partridge, DO [facility] None    No orders of the defined types were placed in this encounter.     -------------------------------

## 2021-06-06 DIAGNOSIS — E538 Deficiency of other specified B group vitamins: Secondary | ICD-10-CM | POA: Diagnosis not present

## 2021-06-06 DIAGNOSIS — M545 Low back pain, unspecified: Secondary | ICD-10-CM | POA: Diagnosis not present

## 2021-06-07 DIAGNOSIS — E782 Mixed hyperlipidemia: Secondary | ICD-10-CM | POA: Diagnosis not present

## 2021-06-07 DIAGNOSIS — Z1329 Encounter for screening for other suspected endocrine disorder: Secondary | ICD-10-CM | POA: Diagnosis not present

## 2021-06-07 DIAGNOSIS — I10 Essential (primary) hypertension: Secondary | ICD-10-CM | POA: Diagnosis not present

## 2021-06-07 DIAGNOSIS — R7303 Prediabetes: Secondary | ICD-10-CM | POA: Diagnosis not present

## 2021-06-07 DIAGNOSIS — E7849 Other hyperlipidemia: Secondary | ICD-10-CM | POA: Diagnosis not present

## 2021-06-07 DIAGNOSIS — K219 Gastro-esophageal reflux disease without esophagitis: Secondary | ICD-10-CM | POA: Diagnosis not present

## 2021-06-07 DIAGNOSIS — E8881 Metabolic syndrome: Secondary | ICD-10-CM | POA: Diagnosis not present

## 2021-06-13 DIAGNOSIS — M47896 Other spondylosis, lumbar region: Secondary | ICD-10-CM | POA: Diagnosis not present

## 2021-06-13 DIAGNOSIS — G894 Chronic pain syndrome: Secondary | ICD-10-CM | POA: Diagnosis not present

## 2021-06-13 DIAGNOSIS — M5416 Radiculopathy, lumbar region: Secondary | ICD-10-CM | POA: Diagnosis not present

## 2021-06-13 DIAGNOSIS — E669 Obesity, unspecified: Secondary | ICD-10-CM | POA: Diagnosis not present

## 2021-06-13 DIAGNOSIS — M5136 Other intervertebral disc degeneration, lumbar region: Secondary | ICD-10-CM | POA: Diagnosis not present

## 2021-06-13 DIAGNOSIS — G43711 Chronic migraine without aura, intractable, with status migrainosus: Secondary | ICD-10-CM | POA: Diagnosis not present

## 2021-06-13 DIAGNOSIS — M130 Polyarthritis, unspecified: Secondary | ICD-10-CM | POA: Diagnosis not present

## 2021-06-13 DIAGNOSIS — M545 Low back pain, unspecified: Secondary | ICD-10-CM | POA: Diagnosis not present

## 2021-06-14 ENCOUNTER — Other Ambulatory Visit: Payer: Self-pay | Admitting: Family Medicine

## 2021-06-14 DIAGNOSIS — F900 Attention-deficit hyperactivity disorder, predominantly inattentive type: Secondary | ICD-10-CM | POA: Diagnosis not present

## 2021-06-14 DIAGNOSIS — R7303 Prediabetes: Secondary | ICD-10-CM | POA: Diagnosis not present

## 2021-06-14 DIAGNOSIS — M5416 Radiculopathy, lumbar region: Secondary | ICD-10-CM

## 2021-06-14 DIAGNOSIS — G43919 Migraine, unspecified, intractable, without status migrainosus: Secondary | ICD-10-CM | POA: Diagnosis not present

## 2021-06-14 DIAGNOSIS — M25562 Pain in left knee: Secondary | ICD-10-CM | POA: Diagnosis not present

## 2021-06-14 DIAGNOSIS — I10 Essential (primary) hypertension: Secondary | ICD-10-CM | POA: Diagnosis not present

## 2021-06-14 DIAGNOSIS — M797 Fibromyalgia: Secondary | ICD-10-CM | POA: Diagnosis not present

## 2021-06-14 DIAGNOSIS — E8881 Metabolic syndrome: Secondary | ICD-10-CM | POA: Diagnosis not present

## 2021-06-14 DIAGNOSIS — R4582 Worries: Secondary | ICD-10-CM | POA: Diagnosis not present

## 2021-07-07 ENCOUNTER — Ambulatory Visit: Payer: Medicare PPO | Admitting: Neurology

## 2021-07-07 DIAGNOSIS — E538 Deficiency of other specified B group vitamins: Secondary | ICD-10-CM | POA: Diagnosis not present

## 2021-07-13 DIAGNOSIS — Z1231 Encounter for screening mammogram for malignant neoplasm of breast: Secondary | ICD-10-CM | POA: Diagnosis not present

## 2021-07-24 ENCOUNTER — Ambulatory Visit
Admission: RE | Admit: 2021-07-24 | Discharge: 2021-07-24 | Disposition: A | Discharge status: Home / Self Care | Payer: Medicare PPO | Source: Ambulatory Visit | Attending: Family Medicine | Admitting: Family Medicine

## 2021-07-24 DIAGNOSIS — M5416 Radiculopathy, lumbar region: Secondary | ICD-10-CM

## 2021-07-24 DIAGNOSIS — M545 Low back pain, unspecified: Secondary | ICD-10-CM | POA: Diagnosis not present

## 2021-07-31 DIAGNOSIS — G43711 Chronic migraine without aura, intractable, with status migrainosus: Secondary | ICD-10-CM | POA: Diagnosis not present

## 2021-07-31 DIAGNOSIS — M5416 Radiculopathy, lumbar region: Secondary | ICD-10-CM | POA: Diagnosis not present

## 2021-07-31 DIAGNOSIS — M130 Polyarthritis, unspecified: Secondary | ICD-10-CM | POA: Diagnosis not present

## 2021-07-31 DIAGNOSIS — G894 Chronic pain syndrome: Secondary | ICD-10-CM | POA: Diagnosis not present

## 2021-07-31 DIAGNOSIS — M545 Low back pain, unspecified: Secondary | ICD-10-CM | POA: Diagnosis not present

## 2021-07-31 DIAGNOSIS — M5136 Other intervertebral disc degeneration, lumbar region: Secondary | ICD-10-CM | POA: Diagnosis not present

## 2021-07-31 DIAGNOSIS — M47896 Other spondylosis, lumbar region: Secondary | ICD-10-CM | POA: Diagnosis not present

## 2021-07-31 DIAGNOSIS — E669 Obesity, unspecified: Secondary | ICD-10-CM | POA: Diagnosis not present

## 2021-08-04 DIAGNOSIS — G894 Chronic pain syndrome: Secondary | ICD-10-CM | POA: Diagnosis not present

## 2021-08-04 DIAGNOSIS — M5416 Radiculopathy, lumbar region: Secondary | ICD-10-CM | POA: Diagnosis not present

## 2021-08-04 DIAGNOSIS — G43711 Chronic migraine without aura, intractable, with status migrainosus: Secondary | ICD-10-CM | POA: Diagnosis not present

## 2021-08-04 DIAGNOSIS — M47896 Other spondylosis, lumbar region: Secondary | ICD-10-CM | POA: Diagnosis not present

## 2021-08-04 DIAGNOSIS — E669 Obesity, unspecified: Secondary | ICD-10-CM | POA: Diagnosis not present

## 2021-08-04 DIAGNOSIS — M130 Polyarthritis, unspecified: Secondary | ICD-10-CM | POA: Diagnosis not present

## 2021-08-04 DIAGNOSIS — M545 Low back pain, unspecified: Secondary | ICD-10-CM | POA: Diagnosis not present

## 2021-08-04 DIAGNOSIS — M5136 Other intervertebral disc degeneration, lumbar region: Secondary | ICD-10-CM | POA: Diagnosis not present

## 2021-08-15 ENCOUNTER — Other Ambulatory Visit: Payer: Self-pay | Admitting: Neurology

## 2021-08-16 ENCOUNTER — Ambulatory Visit: Payer: Medicare PPO | Admitting: Neurology

## 2021-08-30 ENCOUNTER — Ambulatory Visit: Payer: Medicare PPO | Admitting: Psychiatry

## 2021-09-14 ENCOUNTER — Encounter: Payer: Self-pay | Admitting: Psychiatry

## 2021-09-14 ENCOUNTER — Ambulatory Visit: Payer: Medicare PPO | Admitting: Psychiatry

## 2021-09-14 DIAGNOSIS — F338 Other recurrent depressive disorders: Secondary | ICD-10-CM

## 2021-09-14 DIAGNOSIS — F331 Major depressive disorder, recurrent, moderate: Secondary | ICD-10-CM

## 2021-09-14 MED ORDER — BUPROPION HCL ER (XL) 300 MG PO TB24: 300.0000 mg | ORAL_TABLET | Freq: Every day | ORAL | 1 refills | Status: DC

## 2021-09-14 NOTE — Progress Notes (Signed)
Melanie Case ?[id number] ?30-May-1966 ?56 y.o. ? ?Subjective:  ? ?Patient ID:  Melanie Case is a 56 y.o. (DOB 12-18-1965) female. ? ?Chief Complaint:  ?Chief Complaint  ?Patient presents with  ? Anxiety  ? Follow-up  ? Depression  ? Fatigue  ? ? ?HPI ?[name] presents to the office today for follow-up of TRD. ?04/2018 appt noted; ?Very sensitive emotionally and always has been that way.  Sister moved back and is stressful to deal with. Trying to do more projects in the house.  First time in 5 years she'll have company for the holidays DT depression. ?Pt reports that mood is Anxious and stressed with sister.  and describes anxiety as Moderate. Anxiety symptoms include: Excessive Worry,. Pt reports no sleep issues. Pt reports that appetite is good. Pt reports that energy is good and good. Concentration is good. Suicidal thoughts:  denied by patient.  Energy fair DT FM.  Slow in am.  Has had anxiety about driving and just started driving around town, not here. Excited about the holidays for the first time in year.s. ?She does not want changes. ?On Adderall off and on from PCP for focus.  Not sure when she first started stimulants for focus, but for years when she was working DT distractibility. ?Plan no med changes ? ?03/22/2021 appointment with the following noted: ?As noted the patient has not been seen in 3 years. ?Powder keg about to blow bc D recurring addict. D 30 lives with her.  Going on for about 3 years. ?Gets angry and about to explode.  Constantly having to worry about her.   ?Hesitates to take clonazepam 0.5 bc makes her tired.  Only takes at night. ? Doesn't want to go anywhere.  Hx winter depression.  Let herself gain weight and doesn't want anyone to see her.  Not using makeup and ashamed of herself.  Gained weight. ?Adderall doesn't stimulate her but does focus her. ?Plan: To help anger and depression and  ?Start Abilify 5 mg daily ?Reduce the Wellbutrin to 2 tablets daily for 2  weeks, ?Then reduce Wellbutrin to 1 daily for 2 weeks, ?Then stop Wellbutrin ?Continue Pristiq 100 ? ?06/01/2021 appointment with the following noted: ?Out of a fog now.  Much more awake.  Still not a lot of interest in getting up and going out. Needs more motivation to go out in public.  No crying.  No anger outbursts or irritable.  Everybody can tell a difference in same way.   ?Ozempic started by constipation ?Plans to start water aerobics ?Less depression but still feels bad about her weight. ? ?09/14/21 appt noted: ?Not good.  More anxious in the evening.  She felet worse off the Wellbutrin specifically more depressed.  More weepy off Wellbutrin   Started taking Wellbutrin XL 300 ?Taking Pristiq 100 mg daily.  No sign clonzaepam bc doesn't want to get slower. ?Abilify had helped but stopped when started Wellbutrin.   ?Taking Adderall 20 BID but still low energy.  Without Adderall mind jumps from one task to another. ?Trouble taking a shower.   ?Sleeping too much . 9 hours night and 4 hours naps daytime. ?No other triggers for depression except seasonal depression could tell this year more than usually ?Not angry ?No SE ?Lost 20# with Monjaro. ? ?Past Psychiatric Medication Trials: ? Wellbutrin ?Pristiq ?Abilify 5 ? ?Review of Systems:  ?Review of Systems  ?Gastrointestinal:  Positive for abdominal pain and constipation.  ?Musculoskeletal:  Positive for arthralgias.  ?  Neurological:  Negative for tremors and weakness.  ?Psychiatric/Behavioral:  Positive for dysphoric mood. Negative for agitation, behavioral problems, confusion, decreased concentration, hallucinations, self-injury, sleep disturbance and suicidal ideas. The patient is not nervous/anxious and is not hyperactive.   ? ?Medications: I have reviewed the patient's current medications. ? ?Current Outpatient Medications  ?Medication Sig Dispense Refill  ? amphetamine-dextroamphetamine (ADDERALL) 20 MG tablet Take 20 mg by mouth 2 (two) times daily.    ?  atenolol (TENORMIN) 50 MG tablet Take 50 mg by mouth daily.    ? clonazePAM (KLONOPIN) 0.5 MG tablet Take 0.5 mg by mouth 3 (three) times daily as needed for anxiety.    ? desvenlafaxine (PRISTIQ) 100 MG 24 hr tablet Take 1 tablet (100 mg total) by mouth daily. 90 tablet 0  ? HYDROcodone-acetaminophen (NORCO) 7.5-325 MG tablet Take 1 tablet by mouth every 6 (six) hours as needed for moderate pain.    ? ibuprofen (ADVIL,MOTRIN) 200 MG tablet Take 200 mg by mouth 2 (two) times daily.    ? loratadine (CLARITIN) 10 MG tablet Take 10 mg by mouth daily.    ? losartan (COZAAR) 25 MG tablet Take 2 tablets by mouth daily.    ? omeprazole (PRILOSEC) 40 MG capsule Take 40 mg by mouth daily.  4  ? ondansetron (ZOFRAN ODT) 4 MG disintegrating tablet Take 1 tablet (4 mg total) by mouth every 8 (eight) hours as needed for nausea or vomiting. 20 tablet 5  ? simvastatin (ZOCOR) 40 MG tablet Take 40 mg by mouth daily.    ? topiramate (TOPAMAX) 100 MG tablet Take 100 mg by mouth 2 (two) times daily.    ? triamcinolone cream (KENALOG) 0.1 % Apply 1 application topically 3 (three) times daily.    ? ARIPiprazole (ABILIFY) 5 MG tablet Take 1 tablet (5 mg total) by mouth daily. (Patient not taking: Reported on 09/14/2021) 90 tablet 0  ? buPROPion (WELLBUTRIN XL) 300 MG 24 hr tablet Take 1 tablet (300 mg total) by mouth daily. 30 tablet 1  ? ?No current facility-administered medications for this visit.  ? ? ?Medication Side Effects: None ? ?Allergies: No Known Allergies ? ?Past Medical History:  ?Diagnosis Date  ? ADHD (attention deficit hyperactivity disorder)   ? Depression   ? Fibromyalgia   ? GERD (gastroesophageal reflux disease)   ? Hyperlipidemia   ? Hypertension   ? Long term (current) use of opiate analgesic   ? Metabolic syndrome   ? Migraines   ? Obesity   ? Sleep apnea   ? ? ?Family History  ?Problem Relation Age of Onset  ? Hypertension Brother   ? CAD Brother   ? Heart attack Mother   ? Kidney disease Mother   ? ? ?Social  History  ? ?Socioeconomic History  ? Marital status: Married  ?  Spouse name: Not on file  ? Number of children: Not on file  ? Years of education: Not on file  ? Highest education level: Not on file  ?Occupational History  ? Not on file  ?Tobacco Use  ? Smoking status: Former  ?  Types: Cigarettes  ?  Quit date: 06/18/2001  ?  Years since quitting: 20.2  ? Smokeless tobacco: Never  ?Substance and Sexual Activity  ? Alcohol use: Yes  ?  Comment: occasional  ? Drug use: Never  ? Sexual activity: Not on file  ?Other Topics Concern  ? Not on file  ?Social History Narrative  ? Not on file  ? ?  Social Determinants of Health  ? ?Financial Resource Strain: Not on file  ?Food Insecurity: Not on file  ?Transportation Needs: Not on file  ?Physical Activity: Not on file  ?Stress: Not on file  ?Social Connections: Not on file  ?Intimate Partner Violence: Not on file  ? ? ?Past Medical History, Surgical history, Social history, and Family history were reviewed and updated as appropriate.  ? ?Recent normal card stress test and colonoscopy. ? ?Please see review of systems for further details on the patient's review from today.  ? ?Objective:  ? ?Physical Exam:  ?LMP 04/18/2017 (Approximate)  ? ?Physical Exam ?Constitutional:   ?   General: She is not in acute distress. ?   Appearance: She is well-developed.  ?Musculoskeletal:     ?   General: No deformity.  ?Neurological:  ?   Mental Status: She is alert and oriented to person, place, and time.  ?   Motor: No tremor.  ?   Coordination: Coordination normal.  ?   Gait: Gait normal.  ?Psychiatric:     ?   Attention and Perception: Perception normal.     ?   Mood and Affect: Mood is anxious and depressed. Affect is not labile, blunt, angry, tearful or inappropriate.     ?   Speech: Speech normal.     ?   Behavior: Behavior normal.     ?   Thought Content: Thought content normal. Thought content is not delusional. Thought content does not include homicidal or suicidal ideation.     ?    Cognition and Memory: Cognition and memory normal.     ?   Judgment: Judgment normal.  ?   Comments: Insight intact. ?No auditory or visual hallucinations. No delusions.  ?  ? ? ?Lab Review:  ?   ?Component Value Date/Tim

## 2021-09-15 DIAGNOSIS — R7303 Prediabetes: Secondary | ICD-10-CM | POA: Diagnosis not present

## 2021-09-15 DIAGNOSIS — E7849 Other hyperlipidemia: Secondary | ICD-10-CM | POA: Diagnosis not present

## 2021-09-15 DIAGNOSIS — F900 Attention-deficit hyperactivity disorder, predominantly inattentive type: Secondary | ICD-10-CM | POA: Diagnosis not present

## 2021-09-15 DIAGNOSIS — E782 Mixed hyperlipidemia: Secondary | ICD-10-CM | POA: Diagnosis not present

## 2021-09-15 DIAGNOSIS — K2100 Gastro-esophageal reflux disease with esophagitis, without bleeding: Secondary | ICD-10-CM | POA: Diagnosis not present

## 2021-09-15 DIAGNOSIS — I10 Essential (primary) hypertension: Secondary | ICD-10-CM | POA: Diagnosis not present

## 2021-09-15 DIAGNOSIS — Z1329 Encounter for screening for other suspected endocrine disorder: Secondary | ICD-10-CM | POA: Diagnosis not present

## 2021-09-15 DIAGNOSIS — E8881 Metabolic syndrome: Secondary | ICD-10-CM | POA: Diagnosis not present

## 2021-09-20 DIAGNOSIS — G43919 Migraine, unspecified, intractable, without status migrainosus: Secondary | ICD-10-CM | POA: Diagnosis not present

## 2021-09-20 DIAGNOSIS — E8881 Metabolic syndrome: Secondary | ICD-10-CM | POA: Diagnosis not present

## 2021-09-20 DIAGNOSIS — R7303 Prediabetes: Secondary | ICD-10-CM | POA: Diagnosis not present

## 2021-09-20 DIAGNOSIS — K2100 Gastro-esophageal reflux disease with esophagitis, without bleeding: Secondary | ICD-10-CM | POA: Diagnosis not present

## 2021-09-20 DIAGNOSIS — R4582 Worries: Secondary | ICD-10-CM | POA: Diagnosis not present

## 2021-09-20 DIAGNOSIS — I10 Essential (primary) hypertension: Secondary | ICD-10-CM | POA: Diagnosis not present

## 2021-09-20 DIAGNOSIS — Z0001 Encounter for general adult medical examination with abnormal findings: Secondary | ICD-10-CM | POA: Diagnosis not present

## 2021-09-20 DIAGNOSIS — F900 Attention-deficit hyperactivity disorder, predominantly inattentive type: Secondary | ICD-10-CM | POA: Diagnosis not present

## 2021-09-20 DIAGNOSIS — F331 Major depressive disorder, recurrent, moderate: Secondary | ICD-10-CM | POA: Diagnosis not present

## 2021-09-20 DIAGNOSIS — D519 Vitamin B12 deficiency anemia, unspecified: Secondary | ICD-10-CM | POA: Diagnosis not present

## 2021-09-20 DIAGNOSIS — M797 Fibromyalgia: Secondary | ICD-10-CM | POA: Diagnosis not present

## 2021-09-25 DIAGNOSIS — G894 Chronic pain syndrome: Secondary | ICD-10-CM | POA: Diagnosis not present

## 2021-09-25 DIAGNOSIS — E669 Obesity, unspecified: Secondary | ICD-10-CM | POA: Diagnosis not present

## 2021-09-25 DIAGNOSIS — M5416 Radiculopathy, lumbar region: Secondary | ICD-10-CM | POA: Diagnosis not present

## 2021-09-25 DIAGNOSIS — M545 Low back pain, unspecified: Secondary | ICD-10-CM | POA: Diagnosis not present

## 2021-09-25 DIAGNOSIS — G43711 Chronic migraine without aura, intractable, with status migrainosus: Secondary | ICD-10-CM | POA: Diagnosis not present

## 2021-09-25 DIAGNOSIS — M5136 Other intervertebral disc degeneration, lumbar region: Secondary | ICD-10-CM | POA: Diagnosis not present

## 2021-09-25 DIAGNOSIS — M25511 Pain in right shoulder: Secondary | ICD-10-CM | POA: Diagnosis not present

## 2021-09-25 DIAGNOSIS — M5459 Other low back pain: Secondary | ICD-10-CM | POA: Diagnosis not present

## 2021-10-27 DIAGNOSIS — E538 Deficiency of other specified B group vitamins: Secondary | ICD-10-CM | POA: Diagnosis not present

## 2021-11-07 ENCOUNTER — Telehealth (INDEPENDENT_AMBULATORY_CARE_PROVIDER_SITE_OTHER): Payer: Medicare PPO | Admitting: Psychiatry

## 2021-11-07 ENCOUNTER — Encounter: Payer: Self-pay | Admitting: Psychiatry

## 2021-11-07 DIAGNOSIS — F39 Unspecified mood [affective] disorder: Secondary | ICD-10-CM | POA: Diagnosis not present

## 2021-11-07 DIAGNOSIS — F338 Other recurrent depressive disorders: Secondary | ICD-10-CM | POA: Diagnosis not present

## 2021-11-07 DIAGNOSIS — F331 Major depressive disorder, recurrent, moderate: Secondary | ICD-10-CM | POA: Diagnosis not present

## 2021-11-07 MED ORDER — BUPROPION HCL ER (XL) 150 MG PO TB24: 450.0000 mg | ORAL_TABLET | Freq: Every day | ORAL | 0 refills | Status: DC

## 2021-11-07 MED ORDER — ARIPIPRAZOLE 5 MG PO TABS: 5.0000 mg | ORAL_TABLET | Freq: Every day | ORAL | 0 refills | Status: DC

## 2021-11-07 MED ORDER — DESVENLAFAXINE SUCCINATE ER 100 MG PO TB24: 100.0000 mg | ORAL_TABLET | Freq: Every day | ORAL | 0 refills | Status: DC

## 2021-11-07 NOTE — Progress Notes (Signed)
Melanie Case [id number] [birth year] 56 y.o.  Video Visit via My Chart  I connected with pt by video using My Chart and verified that I am speaking with the correct person using two identifiers.   I discussed the limitations, risks, security and privacy concerns of performing an evaluation and management service by My Chart  and the availability of in person appointments. I also discussed with the patient that there may be a patient responsible charge related to this service. The patient expressed understanding and agreed to proceed.  I discussed the assessment and treatment plan with the patient. The patient was provided an opportunity to ask questions and all were answered. The patient agreed with the plan and demonstrated an understanding of the instructions.   The patient was advised to call back or seek an in-person evaluation if the symptoms worsen or if the condition fails to improve as anticipated.  I provided 30 minutes of video time during this encounter.  The patient was located at home and the provider was located office. Session from 1:10 to 1:40 PM  Subjective:   Patient ID:  Melanie Case is a 56 y.o. (DOB 1965-09-10) female.  Chief Complaint:  Chief Complaint  Patient presents with   Follow-up   Depression   Anxiety    HPI [place] presents to the office today for follow-up of TRD. 04/2018 appt noted; Very sensitive emotionally and always has been that way.  Sister moved back and is stressful to deal with. Trying to do more projects in the house.  First time in 5 years she'll have company for the holidays DT depression. Pt reports that mood is Anxious and stressed with sister.  and describes anxiety as Moderate. Anxiety symptoms include: Excessive Worry,. Pt reports no sleep issues. Pt reports that appetite is good. Pt reports that energy is good and good. Concentration is good. Suicidal thoughts:  denied by patient.  Energy fair DT FM.  Slow in am.  Has  had anxiety about driving and just started driving around town, not here. Excited about the holidays for the first time in year.s. She does not want changes. On Adderall off and on from PCP for focus.  Not sure when she first started stimulants for focus, but for years when she was working DT distractibility. Plan no med changes  03/22/2021 appointment with the following noted: As noted the patient has not been seen in 3 years. 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 D 62 lives with her.  Going on for about 3 years. Gets angry and about to explode.  Constantly having to worry about her.   Hesitates to take clonazepam 0.5 bc makes her tired.  Only takes at night.  Doesn't want to go anywhere.  Hx winter depression.  Let herself gain weight and doesn't want anyone to see her.  Not using makeup and ashamed of herself.  Gained weight. Adderall doesn't stimulate her but does focus her. Plan: To help anger and depression and  Start Abilify 5 mg daily Reduce the Wellbutrin to 2 tablets daily for 2 weeks, Then reduce Wellbutrin to 1 daily for 2 weeks, Then stop Wellbutrin Continue Pristiq 100  06/01/2021 appointment with the following noted: Out of a fog now.  Much more awake.  Still not a lot of interest in getting up and going out. Needs more motivation to go out in public.  No crying.  No anger outbursts or irritable.  Everybody can tell a difference  in same way.   Ozempic started by constipation Plans to start water aerobics Less depression but still feels bad about her weight.  09/14/21 appt noted: Not good.  More anxious in the evening.  She felet worse off the Wellbutrin specifically more depressed.  More weepy off Wellbutrin   Started taking Wellbutrin XL 300 Taking Pristiq 100 mg daily.  No sign clonzaepam bc doesn't want to get slower. Abilify had helped but stopped when started Wellbutrin.   Taking Adderall 20 BID but still low energy.  Without Adderall mind jumps from one task  to another. Trouble taking a shower.   Sleeping too much . 9 hours night and 4 hours naps daytime. No other triggers for depression except seasonal depression could tell this year more than usually Not angry No SE Lost 20# with Monjaro. Plan: To help depression and  Restart Abilify 5 mg daily clearly helpful Continue Wellbutrin 300 bc worse off it. Continue Pristiq 100 mg daily Continue Adderall 20 mg BID  11/07/2021 appointment with the following noted: Taking meds as above. Little clonazepam bc sleepy. Biggest problem is low motivation.  depression and anxiety improved on Abilify and less on edge, agitated.  Mood is better.  Can find enjoyment socially but if alone it's a problem.  Hard when you dont have an identity bc not working. No SE Too much sleep still as noted above.   Past Psychiatric Medication Trials:  Wellbutrin Pristiq 100 Abilify 5 Adderall 20 BID   Review of Systems:  Review of Systems  Gastrointestinal:  Positive for abdominal pain and constipation.  Musculoskeletal:  Positive for arthralgias.  Neurological:  Negative for tremors and weakness.  Psychiatric/Behavioral:  Positive for dysphoric mood. Negative for agitation, behavioral problems, confusion, decreased concentration, hallucinations, self-injury, sleep disturbance and suicidal ideas. The patient is not nervous/anxious and is not hyperactive.    Medications: I have reviewed the patient's current medications.  Current Outpatient Medications  Medication Sig Dispense Refill   amphetamine-dextroamphetamine (ADDERALL) 20 MG tablet Take 20 mg by mouth 2 (two) times daily.     atenolol (TENORMIN) 50 MG tablet Take 50 mg by mouth daily.     clonazePAM (KLONOPIN) 0.5 MG tablet Take 0.5 mg by mouth 3 (three) times daily as needed for anxiety.     HYDROcodone-acetaminophen (NORCO) 7.5-325 MG tablet Take 1 tablet by mouth every 6 (six) hours as needed for moderate pain.     ibuprofen (ADVIL,MOTRIN) 200 MG  tablet Take 200 mg by mouth 2 (two) times daily.     loratadine (CLARITIN) 10 MG tablet Take 10 mg by mouth daily.     losartan (COZAAR) 25 MG tablet Take 2 tablets by mouth daily.     omeprazole (PRILOSEC) 40 MG capsule Take 40 mg by mouth daily.  4   ondansetron (ZOFRAN ODT) 4 MG disintegrating tablet Take 1 tablet (4 mg total) by mouth every 8 (eight) hours as needed for nausea or vomiting. 20 tablet 5   simvastatin (ZOCOR) 40 MG tablet Take 40 mg by mouth daily.     topiramate (TOPAMAX) 100 MG tablet Take 100 mg by mouth 2 (two) times daily.     triamcinolone cream (KENALOG) 0.1 % Apply 1 application topically 3 (three) times daily.     ARIPiprazole (ABILIFY) 5 MG tablet Take 1 tablet (5 mg total) by mouth daily. 90 tablet 0   buPROPion (WELLBUTRIN XL) 150 MG 24 hr tablet Take 3 tablets (450 mg total) by mouth daily. 270 tablet 0  desvenlafaxine (PRISTIQ) 100 MG 24 hr tablet Take 1 tablet (100 mg total) by mouth daily. 90 tablet 0   No current facility-administered medications for this visit.    Medication Side Effects: None  Allergies: No Known Allergies  Past Medical History:  Diagnosis Date   ADHD (attention deficit hyperactivity disorder)    Depression    Fibromyalgia    GERD (gastroesophageal reflux disease)    Hyperlipidemia    Hypertension    Long term (current) use of opiate analgesic    Metabolic syndrome    Migraines    Obesity    Sleep apnea     Family History  Problem Relation Age of Onset   Hypertension Brother    CAD Brother    Heart attack Mother    Kidney disease Mother     Social History   Socioeconomic History   Marital status: Married    Spouse name: Not on file   Number of children: Not on file   Years of education: Not on file   Highest education level: Not on file  Occupational History   Not on file  Tobacco Use   Smoking status: Former    Types: Cigarettes    Quit date: 06/18/2001    Years since quitting: 20.4   Smokeless tobacco:  Never  Substance and Sexual Activity   Alcohol use: Yes    Comment: occasional   Drug use: Never   Sexual activity: Not on file  Other Topics Concern   Not on file  Social History Narrative   Not on file   Social Determinants of Health   Financial Resource Strain: Not on file  Food Insecurity: Not on file  Transportation Needs: Not on file  Physical Activity: Not on file  Stress: Not on file  Social Connections: Not on file  Intimate Partner Violence: Not on file    Past Medical History, Surgical history, Social history, and Family history were reviewed and updated as appropriate.   Recent normal card stress test and colonoscopy.  Please see review of systems for further details on the patient's review from today.   Objective:   Physical Exam:  LMP 04/18/2017 (Approximate)   Physical Exam Constitutional:      General: She is not in acute distress.    Appearance: She is well-developed.  Musculoskeletal:        General: No deformity.  Neurological:     Mental Status: She is alert and oriented to person, place, and time.     Motor: No tremor.     Coordination: Coordination normal.     Gait: Gait normal.  Psychiatric:        Attention and Perception: Perception normal.        Mood and Affect: Mood is anxious and depressed. Affect is not labile, blunt, angry, tearful or inappropriate.        Speech: Speech normal.        Behavior: Behavior normal.        Thought Content: Thought content normal. Thought content is not delusional. Thought content does not include homicidal or suicidal ideation.        Cognition and Memory: Cognition and memory normal.        Judgment: Judgment normal.     Comments: Insight intact. No auditory or visual hallucinations. No delusions.  Better not gone    Lab Review:     Component Value Date/Time   BUN 11 10/04/2010 1620   CREATININE 0.85 10/04/2010 1620  GFRNONAA >60 10/04/2010 1620   GFRAA  10/04/2010 1620    >60        The  eGFR has been calculated using the MDRD equation. This calculation has not been validated in all clinical situations. eGFR's persistently <60 mL/min signify possible Chronic Kidney Disease.    No results found for: WBC, RBC, HGB, HCT, PLT, MCV, MCH, MCHC, RDW, LYMPHSABS, MONOABS, EOSABS, BASOSABS  No results found for: POCLITH, LITHIUM   No results found for: PHENYTOIN, PHENOBARB, VALPROATE, CBMZ   .res Assessment: Plan:    Major depressive disorder, recurrent episode, moderate (HCC) - Plan: buPROPion (WELLBUTRIN XL) 150 MG 24 hr tablet, desvenlafaxine (PRISTIQ) 100 MG 24 hr tablet, ARIPiprazole (ABILIFY) 5 MG tablet  Seasonal affective disorder (HCC) - Plan: buPROPion (WELLBUTRIN XL) 150 MG 24 hr tablet  Episodic mood disorder (HCC) - Plan: desvenlafaxine (PRISTIQ) 100 MG 24 hr tablet, ARIPiprazole (ABILIFY) 5 MG tablet  Greater than 50% of 30 min face to face time with patient was spent on counseling and coordination of care. We discussed hx TRD.   Consider lamotrigine bc hx cycling depression with seasonal pattern Depression and anxiety are better with Abilify but not gone.  Disc the risks involved using uppers and downers together.  Also sz risk with the combo of wellbutrin and stimulants.  She seems to be benefitting and tolerating.    Disc progressing in dealing with driving phobias.  Disc rec more socialization.  Not many friends.  Option volunteer work or PT work to [employer].   Mounjaro  for weight loss lost 25#.   Constipation management 1.  Lots of water 2.  Powdered fiber supplement such as MiraLAX, Citrucel, etc. preferably with a meal 3.  2 stool softeners a day 4.  Milk of magnesia or magnesium tablets if needed  To help depression and  Continue Abilify 5 mg daily clearly helpful  Continue Pristiq 100 mg daily Continue Adderall 20 mg BID  Increase Wellbutrin to help motivation and productivity to 450 mg daily.  Discussed the risk of  adrenergic side effects given the med combination.  However she seems to need it present.  Discussed the risk of high blood pressure.  She will monitor.  Consider tapering Pristiq if the above works.  FU 2 mos  .Lynder Parents, Md, DFAPA   No future appointments.   No orders of the defined types were placed in this encounter.     -------------------------------

## 2021-12-06 DIAGNOSIS — E538 Deficiency of other specified B group vitamins: Secondary | ICD-10-CM | POA: Diagnosis not present

## 2021-12-20 DIAGNOSIS — R7303 Prediabetes: Secondary | ICD-10-CM | POA: Diagnosis not present

## 2021-12-20 DIAGNOSIS — K2100 Gastro-esophageal reflux disease with esophagitis, without bleeding: Secondary | ICD-10-CM | POA: Diagnosis not present

## 2021-12-20 DIAGNOSIS — Z1329 Encounter for screening for other suspected endocrine disorder: Secondary | ICD-10-CM | POA: Diagnosis not present

## 2021-12-20 DIAGNOSIS — Z79891 Long term (current) use of opiate analgesic: Secondary | ICD-10-CM | POA: Diagnosis not present

## 2021-12-20 DIAGNOSIS — I10 Essential (primary) hypertension: Secondary | ICD-10-CM | POA: Diagnosis not present

## 2021-12-20 DIAGNOSIS — E7849 Other hyperlipidemia: Secondary | ICD-10-CM | POA: Diagnosis not present

## 2021-12-27 DIAGNOSIS — M797 Fibromyalgia: Secondary | ICD-10-CM | POA: Diagnosis not present

## 2021-12-27 DIAGNOSIS — I10 Essential (primary) hypertension: Secondary | ICD-10-CM | POA: Diagnosis not present

## 2021-12-27 DIAGNOSIS — D519 Vitamin B12 deficiency anemia, unspecified: Secondary | ICD-10-CM | POA: Diagnosis not present

## 2021-12-27 DIAGNOSIS — F900 Attention-deficit hyperactivity disorder, predominantly inattentive type: Secondary | ICD-10-CM | POA: Diagnosis not present

## 2021-12-27 DIAGNOSIS — K2100 Gastro-esophageal reflux disease with esophagitis, without bleeding: Secondary | ICD-10-CM | POA: Diagnosis not present

## 2021-12-27 DIAGNOSIS — E8881 Metabolic syndrome: Secondary | ICD-10-CM | POA: Diagnosis not present

## 2021-12-27 DIAGNOSIS — G43919 Migraine, unspecified, intractable, without status migrainosus: Secondary | ICD-10-CM | POA: Diagnosis not present

## 2021-12-27 DIAGNOSIS — G4733 Obstructive sleep apnea (adult) (pediatric): Secondary | ICD-10-CM | POA: Diagnosis not present

## 2021-12-27 DIAGNOSIS — R7303 Prediabetes: Secondary | ICD-10-CM | POA: Diagnosis not present

## 2022-01-17 DIAGNOSIS — K2100 Gastro-esophageal reflux disease with esophagitis, without bleeding: Secondary | ICD-10-CM | POA: Diagnosis not present

## 2022-01-17 DIAGNOSIS — F900 Attention-deficit hyperactivity disorder, predominantly inattentive type: Secondary | ICD-10-CM | POA: Diagnosis not present

## 2022-01-17 DIAGNOSIS — F331 Major depressive disorder, recurrent, moderate: Secondary | ICD-10-CM | POA: Diagnosis not present

## 2022-01-17 DIAGNOSIS — R7303 Prediabetes: Secondary | ICD-10-CM | POA: Diagnosis not present

## 2022-01-17 DIAGNOSIS — M545 Low back pain, unspecified: Secondary | ICD-10-CM | POA: Diagnosis not present

## 2022-01-17 DIAGNOSIS — E8881 Metabolic syndrome: Secondary | ICD-10-CM | POA: Diagnosis not present

## 2022-01-17 DIAGNOSIS — G43919 Migraine, unspecified, intractable, without status migrainosus: Secondary | ICD-10-CM | POA: Diagnosis not present

## 2022-01-17 DIAGNOSIS — M5136 Other intervertebral disc degeneration, lumbar region: Secondary | ICD-10-CM | POA: Diagnosis not present

## 2022-01-17 DIAGNOSIS — E669 Obesity, unspecified: Secondary | ICD-10-CM | POA: Diagnosis not present

## 2022-01-17 DIAGNOSIS — I10 Essential (primary) hypertension: Secondary | ICD-10-CM | POA: Diagnosis not present

## 2022-01-17 DIAGNOSIS — M797 Fibromyalgia: Secondary | ICD-10-CM | POA: Diagnosis not present

## 2022-01-17 DIAGNOSIS — M5416 Radiculopathy, lumbar region: Secondary | ICD-10-CM | POA: Diagnosis not present

## 2022-01-17 DIAGNOSIS — M25511 Pain in right shoulder: Secondary | ICD-10-CM | POA: Diagnosis not present

## 2022-01-17 DIAGNOSIS — M5459 Other low back pain: Secondary | ICD-10-CM | POA: Diagnosis not present

## 2022-01-17 DIAGNOSIS — G894 Chronic pain syndrome: Secondary | ICD-10-CM | POA: Diagnosis not present

## 2022-01-19 DIAGNOSIS — M1990 Unspecified osteoarthritis, unspecified site: Secondary | ICD-10-CM | POA: Diagnosis not present

## 2022-01-19 DIAGNOSIS — M545 Low back pain, unspecified: Secondary | ICD-10-CM | POA: Diagnosis not present

## 2022-01-19 DIAGNOSIS — D649 Anemia, unspecified: Secondary | ICD-10-CM | POA: Diagnosis not present

## 2022-01-19 DIAGNOSIS — M797 Fibromyalgia: Secondary | ICD-10-CM | POA: Diagnosis not present

## 2022-01-19 DIAGNOSIS — R7301 Impaired fasting glucose: Secondary | ICD-10-CM | POA: Diagnosis not present

## 2022-01-19 DIAGNOSIS — E8881 Metabolic syndrome: Secondary | ICD-10-CM | POA: Diagnosis not present

## 2022-01-19 DIAGNOSIS — K2100 Gastro-esophageal reflux disease with esophagitis, without bleeding: Secondary | ICD-10-CM | POA: Diagnosis not present

## 2022-01-19 DIAGNOSIS — E7849 Other hyperlipidemia: Secondary | ICD-10-CM | POA: Diagnosis not present

## 2022-01-19 DIAGNOSIS — E782 Mixed hyperlipidemia: Secondary | ICD-10-CM | POA: Diagnosis not present

## 2022-01-24 ENCOUNTER — Encounter (INDEPENDENT_AMBULATORY_CARE_PROVIDER_SITE_OTHER): Payer: Self-pay

## 2022-02-05 DIAGNOSIS — N3941 Urge incontinence: Secondary | ICD-10-CM | POA: Diagnosis not present

## 2022-02-05 DIAGNOSIS — B372 Candidiasis of skin and nail: Secondary | ICD-10-CM | POA: Diagnosis not present

## 2022-02-05 DIAGNOSIS — R300 Dysuria: Secondary | ICD-10-CM | POA: Diagnosis not present

## 2022-02-05 DIAGNOSIS — Z6836 Body mass index (BMI) 36.0-36.9, adult: Secondary | ICD-10-CM | POA: Diagnosis not present

## 2022-02-15 ENCOUNTER — Other Ambulatory Visit: Payer: Self-pay

## 2022-02-15 ENCOUNTER — Telehealth: Payer: Self-pay | Admitting: Psychiatry

## 2022-02-15 DIAGNOSIS — F39 Unspecified mood [affective] disorder: Secondary | ICD-10-CM

## 2022-02-15 DIAGNOSIS — F331 Major depressive disorder, recurrent, moderate: Secondary | ICD-10-CM

## 2022-02-15 MED ORDER — DESVENLAFAXINE SUCCINATE ER 100 MG PO TB24: 100.0000 mg | ORAL_TABLET | Freq: Every day | ORAL | 0 refills | Status: DC

## 2022-02-15 NOTE — Telephone Encounter (Signed)
Please call patient to schedule an appt, was due in July.  

## 2022-02-15 NOTE — Telephone Encounter (Signed)
Addendum to attached message. Next appt is 04/30/22. I told her we would call her RX into  [facility] [address], [place] - [address]   Phone:  [phone]  Fax:  [phone]

## 2022-02-15 NOTE — Telephone Encounter (Signed)
Rx sent 

## 2022-02-15 NOTE — Telephone Encounter (Signed)
Last appt was 11/07/21. Requesting refill for Pristiq called to:  [facility] [address], [place] - [address]  Phone:  [phone]  Fax:  [phone]

## 2022-03-12 DIAGNOSIS — M25511 Pain in right shoulder: Secondary | ICD-10-CM | POA: Diagnosis not present

## 2022-03-12 DIAGNOSIS — G894 Chronic pain syndrome: Secondary | ICD-10-CM | POA: Diagnosis not present

## 2022-03-12 DIAGNOSIS — E669 Obesity, unspecified: Secondary | ICD-10-CM | POA: Diagnosis not present

## 2022-03-12 DIAGNOSIS — M5459 Other low back pain: Secondary | ICD-10-CM | POA: Diagnosis not present

## 2022-03-12 DIAGNOSIS — M5136 Other intervertebral disc degeneration, lumbar region: Secondary | ICD-10-CM | POA: Diagnosis not present

## 2022-03-12 DIAGNOSIS — M5416 Radiculopathy, lumbar region: Secondary | ICD-10-CM | POA: Diagnosis not present

## 2022-03-14 DIAGNOSIS — L304 Erythema intertrigo: Secondary | ICD-10-CM | POA: Diagnosis not present

## 2022-03-14 DIAGNOSIS — D239 Other benign neoplasm of skin, unspecified: Secondary | ICD-10-CM | POA: Diagnosis not present

## 2022-03-14 DIAGNOSIS — Z1283 Encounter for screening for malignant neoplasm of skin: Secondary | ICD-10-CM | POA: Diagnosis not present

## 2022-04-05 DIAGNOSIS — E7849 Other hyperlipidemia: Secondary | ICD-10-CM | POA: Diagnosis not present

## 2022-04-05 DIAGNOSIS — I10 Essential (primary) hypertension: Secondary | ICD-10-CM | POA: Diagnosis not present

## 2022-04-05 DIAGNOSIS — R739 Hyperglycemia, unspecified: Secondary | ICD-10-CM | POA: Diagnosis not present

## 2022-04-11 DIAGNOSIS — M797 Fibromyalgia: Secondary | ICD-10-CM | POA: Diagnosis not present

## 2022-04-11 DIAGNOSIS — G43919 Migraine, unspecified, intractable, without status migrainosus: Secondary | ICD-10-CM | POA: Diagnosis not present

## 2022-04-11 DIAGNOSIS — D649 Anemia, unspecified: Secondary | ICD-10-CM | POA: Diagnosis not present

## 2022-04-11 DIAGNOSIS — F331 Major depressive disorder, recurrent, moderate: Secondary | ICD-10-CM | POA: Diagnosis not present

## 2022-04-11 DIAGNOSIS — E559 Vitamin D deficiency, unspecified: Secondary | ICD-10-CM | POA: Diagnosis not present

## 2022-04-11 DIAGNOSIS — F900 Attention-deficit hyperactivity disorder, predominantly inattentive type: Secondary | ICD-10-CM | POA: Diagnosis not present

## 2022-04-11 DIAGNOSIS — E8881 Metabolic syndrome: Secondary | ICD-10-CM | POA: Diagnosis not present

## 2022-04-11 DIAGNOSIS — I10 Essential (primary) hypertension: Secondary | ICD-10-CM | POA: Diagnosis not present

## 2022-04-11 DIAGNOSIS — R7303 Prediabetes: Secondary | ICD-10-CM | POA: Diagnosis not present

## 2022-04-30 ENCOUNTER — Encounter: Payer: Self-pay | Admitting: Psychiatry

## 2022-04-30 ENCOUNTER — Ambulatory Visit (INDEPENDENT_AMBULATORY_CARE_PROVIDER_SITE_OTHER): Payer: Medicare PPO | Admitting: Psychiatry

## 2022-04-30 VITALS — BP 131/88 | HR 76

## 2022-04-30 DIAGNOSIS — F331 Major depressive disorder, recurrent, moderate: Secondary | ICD-10-CM | POA: Diagnosis not present

## 2022-04-30 DIAGNOSIS — F4323 Adjustment disorder with mixed anxiety and depressed mood: Secondary | ICD-10-CM | POA: Diagnosis not present

## 2022-04-30 DIAGNOSIS — F902 Attention-deficit hyperactivity disorder, combined type: Secondary | ICD-10-CM

## 2022-04-30 DIAGNOSIS — F338 Other recurrent depressive disorders: Secondary | ICD-10-CM | POA: Diagnosis not present

## 2022-04-30 DIAGNOSIS — F39 Unspecified mood [affective] disorder: Secondary | ICD-10-CM | POA: Diagnosis not present

## 2022-04-30 MED ORDER — AMPHETAMINE-DEXTROAMPHETAMINE 20 MG PO TABS: 20.0000 mg | ORAL_TABLET | Freq: Two times a day (BID) | ORAL | 0 refills | Status: AC

## 2022-04-30 MED ORDER — BUPROPION HCL ER (XL) 150 MG PO TB24: 450.0000 mg | ORAL_TABLET | Freq: Every day | ORAL | 1 refills | Status: AC

## 2022-04-30 MED ORDER — ARIPIPRAZOLE 5 MG PO TABS: 5.0000 mg | ORAL_TABLET | Freq: Every day | ORAL | 1 refills | Status: AC

## 2022-04-30 MED ORDER — DESVENLAFAXINE SUCCINATE ER 100 MG PO TB24: 100.0000 mg | ORAL_TABLET | Freq: Every day | ORAL | 1 refills | Status: AC

## 2022-04-30 NOTE — Progress Notes (Signed)
Melanie Case [id number] 1966-02-19 56 y.o.   Subjective:   Patient ID:  Melanie Case is a 56 y.o. (DOB [birth year]) female.  Chief Complaint:  Chief Complaint  Patient presents with   Follow-up   Depression    HPI [place] presents to the office today for follow-up of TRD. 04/2018 appt noted; Very sensitive emotionally and always has been that way.  Sister moved back and is stressful to deal with. Trying to do more projects in the house.  First time in 5 years she'll have company for the holidays DT depression. Pt reports that mood is Anxious and stressed with sister.  and describes anxiety as Moderate. Anxiety symptoms include: Excessive Worry,. Pt reports no sleep issues. Pt reports that appetite is good. Pt reports that energy is good and good. Concentration is good. Suicidal thoughts:  denied by patient.  Energy fair DT FM.  Slow in am.  Has had anxiety about driving and just started driving around town, not here. Excited about the holidays for the first time in year.s. She does not want changes. On Adderall off and on from PCP for focus.  Not sure when she first started stimulants for focus, but for years when she was working DT distractibility. Plan no med changes  03/22/2021 appointment with the following noted: As noted the patient has not been seen in 3 years. 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 D 34 lives with her.  Going on for about 3 years. Gets angry and about to explode.  Constantly having to worry about her.   Hesitates to take clonazepam 0.5 bc makes her tired.  Only takes at night.  Doesn't want to go anywhere.  Hx winter depression.  Let herself gain weight and doesn't want anyone to see her.  Not using makeup and ashamed of herself.  Gained weight. Adderall doesn't stimulate her but does focus her. Plan: To help anger and depression and  Start Abilify 5 mg daily Reduce the Wellbutrin to 2 tablets daily for 2 weeks, Then reduce Wellbutrin  to 1 daily for 2 weeks, Then stop Wellbutrin Continue Pristiq 100  06/01/2021 appointment with the following noted: Out of a fog now.  Much more awake.  Still not a lot of interest in getting up and going out. Needs more motivation to go out in public.  No crying.  No anger outbursts or irritable.  Everybody can tell a difference in same way.   Ozempic started by constipation Plans to start water aerobics Less depression but still feels bad about her weight.  09/14/21 appt noted: Not good.  More anxious in the evening.  She felet worse off the Wellbutrin specifically more depressed.  More weepy off Wellbutrin   Started taking Wellbutrin XL 300 Taking Pristiq 100 mg daily.  No sign clonzaepam bc doesn't want to get slower. Abilify had helped but stopped when started Wellbutrin.   Taking Adderall 20 BID but still low energy.  Without Adderall mind jumps from one task to another. Trouble taking a shower.   Sleeping too much . 9 hours night and 4 hours naps daytime. No other triggers for depression except seasonal depression could tell this year more than usually Not angry No SE Lost 20# with Monjaro. Plan: To help depression and  Restart Abilify 5 mg daily clearly helpful Continue Wellbutrin 300 bc worse off it. Continue Pristiq 100 mg daily Continue Adderall 20 mg BID  11/07/2021 appointment with the following noted: Taking meds  as above. Little clonazepam bc sleepy. Biggest problem is low motivation.  depression and anxiety improved on Abilify and less on edge, agitated.  Mood is better.  Can find enjoyment socially but if alone it's a problem.  Hard when you dont have an identity bc not working. No SE Too much sleep still as noted above. Plan: To help depression and  Continue Abilify 5 mg daily clearly helpful Continue Pristiq 100 mg daily Continue Adderall 20 mg BID Increase Wellbutrin to help motivation and productivity to 450 mg daily.  04/30/2022 appointment noted: Was  feeling better but H cas cancer Stage 4 prostate and in his bones.  Found out in August.  PSA was not elevated.  Doing best she can with that.  He's on IV chemo.  His pain is controlled with meds. Up and down at night checking on him.  He still works other than chemo week.  Gave him prognosis of 1-5 years.  He's handling it better than she. No problems with psych meds.  Uses pill box.   Some anxiety and depression depending on where her mind is at the moment.  Situational. Probably increase dose helped.   No med changes.  Past Psychiatric Medication Trials:  Wellbutrin 450 Pristiq 100 Abilify 5 Adderall 20 BID   Review of Systems:  Review of Systems  Gastrointestinal:  Positive for abdominal pain and constipation.  Musculoskeletal:  Positive for arthralgias.  Neurological:  Negative for tremors.  Psychiatric/Behavioral:  Positive for dysphoric mood. Negative for agitation, behavioral problems, confusion, decreased concentration, hallucinations, self-injury, sleep disturbance and suicidal ideas. The patient is nervous/anxious. The patient is not hyperactive.     Medications: I have reviewed the patient's current medications.  Current Outpatient Medications  Medication Sig Dispense Refill   amphetamine-dextroamphetamine (ADDERALL) 20 MG tablet Take 20 mg by mouth 2 (two) times daily.     ARIPiprazole (ABILIFY) 5 MG tablet Take 1 tablet (5 mg total) by mouth daily. 90 tablet 0   atenolol (TENORMIN) 50 MG tablet Take 50 mg by mouth daily.     buPROPion (WELLBUTRIN XL) 150 MG 24 hr tablet Take 3 tablets (450 mg total) by mouth daily. 270 tablet 0   clonazePAM (KLONOPIN) 0.5 MG tablet Take 0.5 mg by mouth 3 (three) times daily as needed for anxiety.     desvenlafaxine (PRISTIQ) 100 MG 24 hr tablet Take 1 tablet (100 mg total) by mouth daily. 90 tablet 0   HYDROcodone-acetaminophen (NORCO) 7.5-325 MG tablet Take 1 tablet by mouth every 6 (six) hours as needed for moderate pain.      ibuprofen (ADVIL,MOTRIN) 200 MG tablet Take 200 mg by mouth 2 (two) times daily.     loratadine (CLARITIN) 10 MG tablet Take 10 mg by mouth daily.     losartan (COZAAR) 25 MG tablet Take 2 tablets by mouth daily.     omeprazole (PRILOSEC) 40 MG capsule Take 40 mg by mouth daily.  4   ondansetron (ZOFRAN ODT) 4 MG disintegrating tablet Take 1 tablet (4 mg total) by mouth every 8 (eight) hours as needed for nausea or vomiting. 20 tablet 5   simvastatin (ZOCOR) 40 MG tablet Take 40 mg by mouth daily.     topiramate (TOPAMAX) 100 MG tablet Take 100 mg by mouth 2 (two) times daily.     triamcinolone cream (KENALOG) 0.1 % Apply 1 application topically 3 (three) times daily.     No current facility-administered medications for this visit.    Medication Side  Effects: None  Allergies: No Known Allergies  Past Medical History:  Diagnosis Date   ADHD (attention deficit hyperactivity disorder)    Depression    Fibromyalgia    GERD (gastroesophageal reflux disease)    Hyperlipidemia    Hypertension    Long term (current) use of opiate analgesic    Metabolic syndrome    Migraines    Obesity    Sleep apnea     Family History  Problem Relation Age of Onset   Hypertension Brother    CAD Brother    Heart attack Mother    Kidney disease Mother     Social History   Socioeconomic History   Marital status: Married    Spouse name: Not on file   Number of children: Not on file   Years of education: Not on file   Highest education level: Not on file  Occupational History   Not on file  Tobacco Use   Smoking status: Former    Types: Cigarettes    Quit date: 06/18/2001    Years since quitting: 20.8   Smokeless tobacco: Never  Substance and Sexual Activity   Alcohol use: Yes    Comment: occasional   Drug use: Never   Sexual activity: Not on file  Other Topics Concern   Not on file  Social History Narrative   Not on file   Social Determinants of Health   Financial Resource Strain:  Not on file  Food Insecurity: Not on file  Transportation Needs: Not on file  Physical Activity: Not on file  Stress: Not on file  Social Connections: Not on file  Intimate Partner Violence: Not on file    Past Medical History, Surgical history, Social history, and Family history were reviewed and updated as appropriate.   Recent normal card stress test and colonoscopy.  Please see review of systems for further details on the patient's review from today.   Objective:   Physical Exam:  BP 131/88   Pulse 76   LMP 04/18/2017 (Approximate)   Physical Exam Constitutional:      General: She is not in acute distress.    Appearance: She is well-developed.  Musculoskeletal:        General: No deformity.  Neurological:     Mental Status: She is alert and oriented to person, place, and time.     Motor: No tremor.     Coordination: Coordination normal.     Gait: Gait normal.  Psychiatric:        Attention and Perception: Perception normal.        Mood and Affect: Mood is anxious and depressed. Affect is not labile, blunt, angry, tearful or inappropriate.        Speech: Speech normal.        Behavior: Behavior normal.        Thought Content: Thought content normal. Thought content is not delusional. Thought content does not include homicidal or suicidal ideation.        Cognition and Memory: Cognition and memory normal.        Judgment: Judgment normal.     Comments: Insight intact. No auditory or visual hallucinations. No delusions.  Fidgety for years     Lab Review:     Component Value Date/Time   BUN 11 10/04/2010 1620   CREATININE 0.85 10/04/2010 1620   GFRNONAA >60 10/04/2010 1620   GFRAA  10/04/2010 1620    >60        The eGFR  has been calculated using the MDRD equation. This calculation has not been validated in all clinical situations. eGFR's persistently <60 mL/min signify possible Chronic Kidney Disease.    No results found for: "WBC", "RBC", "HGB",  "HCT", "PLT", "MCV", "MCH", "MCHC", "RDW", "LYMPHSABS", "MONOABS", "EOSABS", "BASOSABS"  No results found for: "POCLITH", "LITHIUM"   No results found for: "PHENYTOIN", "PHENOBARB", "VALPROATE", "CBMZ"   .res Assessment: Plan:    Major depressive disorder, recurrent episode, moderate (HCC)  Episodic mood disorder (HCC)  Seasonal affective disorder (HCC)  Situational mixed anxiety and depressive disorder  Greater than 50% of 30 min face to face time with patient was spent on counseling and coordination of care. We discussed hx TRD.   Consider lamotrigine bc hx cycling depression with seasonal pattern Depression and anxiety were better with Abilify but not gone but situation is creating some emotional problems relate to H's stage 4  cancer.  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  Also sz risk with the combo of wellbutrin and stimulants.  She seems to be benefitting and tolerating.    Disc progressing in dealing with driving phobias.  Disc rec more socialization.  Not many friends.  Option volunteer work or PT work to [employer].   Mounjaro  for weight loss lost 25#.   Constipation management 1.  Lots of water 2.  Powdered fiber supplement such as MiraLAX, Citrucel, etc. preferably with a meal 3.  2 stool softeners a day 4.  Milk of magnesia or magnesium tablets if needed  To help depression and  Continue Abilify 5 mg daily clearly helpful  Continue Pristiq 100 mg daily Continue Adderall 20 mg BID  Continue Wellbutrin to help motivation and productivity to 450 mg daily.  Discussed the risk of adrenergic side effects given the med combination.  However she seems to need it present.  Discussed the risk of high blood pressure.  She will monitor.  Consider tapering Pristiq if the above works.  FU 4-6 mos  .Lynder Parents, Md, DFAPA   No future appointments.   No orders of the defined types  were placed in this encounter.     -------------------------------

## 2022-05-16 DIAGNOSIS — M25511 Pain in right shoulder: Secondary | ICD-10-CM | POA: Diagnosis not present

## 2022-05-16 DIAGNOSIS — M5416 Radiculopathy, lumbar region: Secondary | ICD-10-CM | POA: Diagnosis not present

## 2022-05-16 DIAGNOSIS — M5136 Other intervertebral disc degeneration, lumbar region: Secondary | ICD-10-CM | POA: Diagnosis not present

## 2022-05-16 DIAGNOSIS — M5459 Other low back pain: Secondary | ICD-10-CM | POA: Diagnosis not present

## 2022-06-26 DIAGNOSIS — H524 Presbyopia: Secondary | ICD-10-CM | POA: Diagnosis not present

## 2022-06-26 DIAGNOSIS — H35033 Hypertensive retinopathy, bilateral: Secondary | ICD-10-CM | POA: Diagnosis not present

## 2022-06-26 DIAGNOSIS — H2513 Age-related nuclear cataract, bilateral: Secondary | ICD-10-CM | POA: Diagnosis not present

## 2022-06-26 DIAGNOSIS — H40053 Ocular hypertension, bilateral: Secondary | ICD-10-CM | POA: Diagnosis not present

## 2022-06-26 DIAGNOSIS — H52221 Regular astigmatism, right eye: Secondary | ICD-10-CM | POA: Diagnosis not present

## 2022-06-26 DIAGNOSIS — H5203 Hypermetropia, bilateral: Secondary | ICD-10-CM | POA: Diagnosis not present

## 2022-07-17 DIAGNOSIS — R7301 Impaired fasting glucose: Secondary | ICD-10-CM | POA: Diagnosis not present

## 2022-07-17 DIAGNOSIS — Z1231 Encounter for screening mammogram for malignant neoplasm of breast: Secondary | ICD-10-CM | POA: Diagnosis not present

## 2022-07-17 DIAGNOSIS — E7849 Other hyperlipidemia: Secondary | ICD-10-CM | POA: Diagnosis not present

## 2022-07-17 DIAGNOSIS — R739 Hyperglycemia, unspecified: Secondary | ICD-10-CM | POA: Diagnosis not present

## 2022-07-17 DIAGNOSIS — I10 Essential (primary) hypertension: Secondary | ICD-10-CM | POA: Diagnosis not present

## 2022-07-20 DIAGNOSIS — K2100 Gastro-esophageal reflux disease with esophagitis, without bleeding: Secondary | ICD-10-CM | POA: Diagnosis not present

## 2022-07-20 DIAGNOSIS — M797 Fibromyalgia: Secondary | ICD-10-CM | POA: Diagnosis not present

## 2022-07-20 DIAGNOSIS — F900 Attention-deficit hyperactivity disorder, predominantly inattentive type: Secondary | ICD-10-CM | POA: Diagnosis not present

## 2022-07-20 DIAGNOSIS — F331 Major depressive disorder, recurrent, moderate: Secondary | ICD-10-CM | POA: Diagnosis not present

## 2022-07-20 DIAGNOSIS — E8881 Metabolic syndrome: Secondary | ICD-10-CM | POA: Diagnosis not present

## 2022-07-20 DIAGNOSIS — G43919 Migraine, unspecified, intractable, without status migrainosus: Secondary | ICD-10-CM | POA: Diagnosis not present

## 2022-07-20 DIAGNOSIS — E559 Vitamin D deficiency, unspecified: Secondary | ICD-10-CM | POA: Diagnosis not present

## 2022-07-20 DIAGNOSIS — I10 Essential (primary) hypertension: Secondary | ICD-10-CM | POA: Diagnosis not present

## 2022-07-20 DIAGNOSIS — R7303 Prediabetes: Secondary | ICD-10-CM | POA: Diagnosis not present

## 2022-09-16 DIAGNOSIS — N3941 Urge incontinence: Secondary | ICD-10-CM | POA: Diagnosis not present

## 2022-09-16 DIAGNOSIS — R7303 Prediabetes: Secondary | ICD-10-CM | POA: Diagnosis not present

## 2022-09-20 DIAGNOSIS — R7303 Prediabetes: Secondary | ICD-10-CM | POA: Diagnosis not present

## 2022-09-20 DIAGNOSIS — K2100 Gastro-esophageal reflux disease with esophagitis, without bleeding: Secondary | ICD-10-CM | POA: Diagnosis not present

## 2022-09-20 DIAGNOSIS — E559 Vitamin D deficiency, unspecified: Secondary | ICD-10-CM | POA: Diagnosis not present

## 2022-09-20 DIAGNOSIS — R739 Hyperglycemia, unspecified: Secondary | ICD-10-CM | POA: Diagnosis not present

## 2022-09-20 DIAGNOSIS — D649 Anemia, unspecified: Secondary | ICD-10-CM | POA: Diagnosis not present

## 2022-09-20 DIAGNOSIS — E7849 Other hyperlipidemia: Secondary | ICD-10-CM | POA: Diagnosis not present

## 2022-09-20 DIAGNOSIS — I10 Essential (primary) hypertension: Secondary | ICD-10-CM | POA: Diagnosis not present

## 2022-09-20 DIAGNOSIS — D519 Vitamin B12 deficiency anemia, unspecified: Secondary | ICD-10-CM | POA: Diagnosis not present

## 2022-09-20 DIAGNOSIS — E782 Mixed hyperlipidemia: Secondary | ICD-10-CM | POA: Diagnosis not present

## 2022-09-27 DIAGNOSIS — E559 Vitamin D deficiency, unspecified: Secondary | ICD-10-CM | POA: Diagnosis not present

## 2022-09-27 DIAGNOSIS — F900 Attention-deficit hyperactivity disorder, predominantly inattentive type: Secondary | ICD-10-CM | POA: Diagnosis not present

## 2022-09-27 DIAGNOSIS — I10 Essential (primary) hypertension: Secondary | ICD-10-CM | POA: Diagnosis not present

## 2022-09-27 DIAGNOSIS — Z0001 Encounter for general adult medical examination with abnormal findings: Secondary | ICD-10-CM | POA: Diagnosis not present

## 2022-09-27 DIAGNOSIS — Z1329 Encounter for screening for other suspected endocrine disorder: Secondary | ICD-10-CM | POA: Diagnosis not present

## 2022-09-27 DIAGNOSIS — F331 Major depressive disorder, recurrent, moderate: Secondary | ICD-10-CM | POA: Diagnosis not present

## 2022-09-27 DIAGNOSIS — M797 Fibromyalgia: Secondary | ICD-10-CM | POA: Diagnosis not present

## 2022-09-27 DIAGNOSIS — E8881 Metabolic syndrome: Secondary | ICD-10-CM | POA: Diagnosis not present

## 2022-09-27 DIAGNOSIS — R7303 Prediabetes: Secondary | ICD-10-CM | POA: Diagnosis not present

## 2022-09-27 DIAGNOSIS — K2100 Gastro-esophageal reflux disease with esophagitis, without bleeding: Secondary | ICD-10-CM | POA: Diagnosis not present

## 2022-09-27 DIAGNOSIS — G43919 Migraine, unspecified, intractable, without status migrainosus: Secondary | ICD-10-CM | POA: Diagnosis not present

## 2022-09-27 DIAGNOSIS — G4733 Obstructive sleep apnea (adult) (pediatric): Secondary | ICD-10-CM | POA: Diagnosis not present

## 2022-10-29 ENCOUNTER — Ambulatory Visit (INDEPENDENT_AMBULATORY_CARE_PROVIDER_SITE_OTHER): Payer: Self-pay | Admitting: Psychiatry

## 2022-10-29 DIAGNOSIS — Z91199 Patient's noncompliance with other medical treatment and regimen due to unspecified reason: Secondary | ICD-10-CM

## 2022-10-29 NOTE — Progress Notes (Signed)
No show

## 2022-10-31 DIAGNOSIS — M5416 Radiculopathy, lumbar region: Secondary | ICD-10-CM | POA: Diagnosis not present

## 2022-10-31 DIAGNOSIS — R030 Elevated blood-pressure reading, without diagnosis of hypertension: Secondary | ICD-10-CM | POA: Diagnosis not present

## 2022-12-05 ENCOUNTER — Ambulatory Visit: Payer: Medicare PPO | Admitting: Neurology

## 2022-12-05 ENCOUNTER — Encounter: Payer: Self-pay | Admitting: Neurology

## 2022-12-05 VITALS — BP 134/78 | HR 67 | Ht 65.0 in | Wt 215.0 lb

## 2022-12-05 DIAGNOSIS — Z8679 Personal history of other diseases of the circulatory system: Secondary | ICD-10-CM | POA: Diagnosis not present

## 2022-12-05 DIAGNOSIS — G4485 Primary stabbing headache: Secondary | ICD-10-CM | POA: Diagnosis not present

## 2022-12-05 NOTE — Patient Instructions (Addendum)
Will check CTA of head. We have sent a referral to [facility] for your CTA and they will call you directly to schedule your appointment. They are located at [address] [facility]. If you need to contact them directly please call [phone].  Start melatonin 10mg  at bedtime  This may be Primary Stabbing Headache.

## 2022-12-05 NOTE — Progress Notes (Signed)
NEUROLOGY FOLLOW UP OFFICE NOTE  Melanie Case [id number]  Assessment/Plan:   New-onset stabbing headache - may be primary stabbing headache but need to rule out secondary intracranial etiology such as aneurysm. History of cerebral aneurysm, status post coling.  Migraine without aura, without status migrainosus, not intractable - stable as treated by PCP   Check urgent CTA of head In meantime, she will start melatonin 10mg  at bedtime.  If CTA unremarkable and headache persists, consider increasing gabapentin to 400mg  twice daily Follow up in 6 months or sooner if needed.       Subjective:  Melanie Case is a 57 year old right-handed female with HTN, ADHD, chronic low back pain, fibromyalgia, HLD, sleep apnea and history of cerebral aneurysm s/p coiling who follows up for headaches.  UPDATE: Last seen in initial consultation in July 2022.  At that time, she was going to start Ajovy and topiramate and she was given samples of Ubrelvy and Nurtec to try for acute management.  Neither worked.  Her insurance wouldn't cover Ajovy so she started [employer]  Headaches have improved.  They were initially daily.  Now, they occur 1 to 2 times a week at the most.  Treats successfully with ibuprofen.    She started having a new headache a month ago.  It is a severe "bolt of lightening" just left of vertex lasting 30 seconds.  They occur several times a day.  Associated with photophobia but no visual disturbance, nausea, vomiting, numbness or weakness.  Occurs spontaneously, even may wake her up at night out of sleep.  She is under a lot of stress.  She has stage 4 cancer.   Current NSAIDS/analgesics:  Motrin, percocet Current triptans:  none Current ergotamine:  none Current anti-emetic:  Zofran ODT 4mg  Current muscle relaxants:  none Current Antihypertensive medications:  atenolol, losartan Current Antidepressant medications:  desvenlafaxine, Wellbutrin, sertraline 100mg  daily Current  Anticonvulsant medications: gabapentin 300mg  BID, Lyrica 300mg  BID Current anti-CGRP:  Emgality Current Vitamins/Herbal/Supplements:  none Current Antihistamines/Decongestants:  loratidine Other therapy:  none Hormone/birth control:  none Other medications:  Klonopin TID PRN, Adderall, Abilfy   HISTORY: History of migraines since her 55s.  Typical migraines are severe bifrontal/back of head (but may be diffuse) pounding/stabbing pain.  They are associated nausea, photophobia, phonophobia, and possible blurred vision.  Typically last a day but sometimes into next day.  However, knocks out quickly with hydrocodone and ibuprofen.  Occur 3 to 4 a month.  Bright light, strenuous activity and change in weather are triggers.     She has history of cerebral aneurysm s/p coiling in 2004.  Presented as more severe headache with vomiting in association with nausea.  Most recent imaging was an MRI of brain without contrast on 09/04/2017 demonstrated left MCA coiling stable compared to prior studies, as well as small hyperintense focus in left occipital parietal white matter, stable from 2009.      Past NSAIDS/analgesics:  Excedrin, Tyenol Past abortive triptans:  none Past abortive ergotamine:  none Past muscle relaxants:  none Past anti-emetic:  none Past antihypertensive medications:  none Past antidepressant medications:  amitriptyline, sertraline Past anticonvulsant medications:  topiramate Past anti-CGRP:  Emgality, Ajovy Past vitamins/Herbal/Supplements:  none Past antihistamines/decongestants:  none Other past therapies:  none   Caffeine:  No coffee.  Drinks decaf tea.   Diet:  Zoye ale, needs to increase water intake.   Exercise:  no due to pain Depression:  yes; Anxiety:  yes Other pain:  Chronic low back pain, fibromyalgia.  On chronic opioid use. Sleep hygiene:  OSA.  PAST MEDICAL HISTORY: Past Medical History:  Diagnosis Date   ADHD (attention deficit hyperactivity disorder)     Depression    Fibromyalgia    GERD (gastroesophageal reflux disease)    Hyperlipidemia    Hypertension    Long term (current) use of opiate analgesic    Metabolic syndrome    Migraines    Obesity    Sleep apnea     MEDICATIONS: Current Outpatient Medications on File Prior to Visit  Medication Sig Dispense Refill   amphetamine-dextroamphetamine (ADDERALL) 20 MG tablet Take 1 tablet (20 mg total) by mouth 2 (two) times daily. 60 tablet 0   ARIPiprazole (ABILIFY) 5 MG tablet Take 1 tablet (5 mg total) by mouth daily. 90 tablet 1   atenolol (TENORMIN) 50 MG tablet Take 50 mg by mouth daily.     buPROPion (WELLBUTRIN XL) 150 MG 24 hr tablet Take 3 tablets (450 mg total) by mouth daily. 270 tablet 1   clonazePAM (KLONOPIN) 0.5 MG tablet Take 0.5 mg by mouth 3 (three) times daily as needed for anxiety.     desvenlafaxine (PRISTIQ) 100 MG 24 hr tablet Take 1 tablet (100 mg total) by mouth daily. 90 tablet 1   HYDROcodone-acetaminophen (NORCO) 7.5-325 MG tablet Take 1 tablet by mouth every 6 (six) hours as needed for moderate pain.     ibuprofen (ADVIL,MOTRIN) 200 MG tablet Take 200 mg by mouth 2 (two) times daily.     loratadine (CLARITIN) 10 MG tablet Take 10 mg by mouth daily.     losartan (COZAAR) 25 MG tablet Take 2 tablets by mouth daily.     omeprazole (PRILOSEC) 40 MG capsule Take 40 mg by mouth daily.  4   ondansetron (ZOFRAN ODT) 4 MG disintegrating tablet Take 1 tablet (4 mg total) by mouth every 8 (eight) hours as needed for nausea or vomiting. 20 tablet 5   simvastatin (ZOCOR) 40 MG tablet Take 40 mg by mouth daily.     topiramate (TOPAMAX) 100 MG tablet Take 100 mg by mouth 2 (two) times daily.     triamcinolone cream (KENALOG) 0.1 % Apply 1 application topically 3 (three) times daily.     No current facility-administered medications on file prior to visit.    ALLERGIES: No Known Allergies  FAMILY HISTORY: Family History  Problem Relation Age of Onset   Hypertension  Brother    CAD Brother    Heart attack Mother    Kidney disease Mother       Objective:  Blood pressure 134/78, pulse 67, height 5\' 5"  (1.651 m), weight 215 lb (97.5 kg), last menstrual period 04/18/2017, SpO2 97 %. General: No acute distress.  Patient appears well-groomed.   Head:  Normocephalic/atraumatic Eyes:  Fundi examined but not visualized Neck: supple, no paraspinal tenderness, full range of motion Heart:  Regular rate and rhythm Lungs:  Clear to auscultation bilaterally Back: No paraspinal tenderness Neurological Exam: alert and oriented.  Speech fluent and not dysarthric, language intact.  CN II-XII intact. Bulk and tone normal, muscle strength 5/5 throughout.  Sensation to light touch intact.  Deep tendon reflexes 2+ throughout, toes downgoing.  Finger to nose testing intact.  Gait normal, Romberg negative.   Shon Millet, DO  CC: Donzetta Sprung, MD

## 2022-12-12 ENCOUNTER — Ambulatory Visit
Admission: RE | Admit: 2022-12-12 | Discharge: 2022-12-12 | Disposition: A | Discharge status: Home / Self Care | Payer: Medicare PPO | Source: Ambulatory Visit | Attending: Neurology | Admitting: Neurology

## 2022-12-12 DIAGNOSIS — R519 Headache, unspecified: Secondary | ICD-10-CM | POA: Diagnosis not present

## 2022-12-12 DIAGNOSIS — G4485 Primary stabbing headache: Secondary | ICD-10-CM

## 2022-12-12 DIAGNOSIS — Z8679 Personal history of other diseases of the circulatory system: Secondary | ICD-10-CM

## 2022-12-12 MED ORDER — IOPAMIDOL (ISOVUE-370) INJECTION 76%
75.0000 mL | Freq: Once | INTRAVENOUS | Status: AC | PRN
  Administered 2022-12-12: 75 mL via INTRAVENOUS

## 2022-12-13 NOTE — Progress Notes (Signed)
Patient advised of her CTA results.

## 2023-01-01 DIAGNOSIS — R7303 Prediabetes: Secondary | ICD-10-CM | POA: Diagnosis not present

## 2023-01-01 DIAGNOSIS — R739 Hyperglycemia, unspecified: Secondary | ICD-10-CM | POA: Diagnosis not present

## 2023-01-01 DIAGNOSIS — E7849 Other hyperlipidemia: Secondary | ICD-10-CM | POA: Diagnosis not present

## 2023-01-01 DIAGNOSIS — K219 Gastro-esophageal reflux disease without esophagitis: Secondary | ICD-10-CM | POA: Diagnosis not present

## 2023-01-08 DIAGNOSIS — I10 Essential (primary) hypertension: Secondary | ICD-10-CM | POA: Diagnosis not present

## 2023-01-08 DIAGNOSIS — M797 Fibromyalgia: Secondary | ICD-10-CM | POA: Diagnosis not present

## 2023-01-08 DIAGNOSIS — E559 Vitamin D deficiency, unspecified: Secondary | ICD-10-CM | POA: Diagnosis not present

## 2023-01-08 DIAGNOSIS — R7303 Prediabetes: Secondary | ICD-10-CM | POA: Diagnosis not present

## 2023-01-08 DIAGNOSIS — F900 Attention-deficit hyperactivity disorder, predominantly inattentive type: Secondary | ICD-10-CM | POA: Diagnosis not present

## 2023-01-08 DIAGNOSIS — E8881 Metabolic syndrome: Secondary | ICD-10-CM | POA: Diagnosis not present

## 2023-01-08 DIAGNOSIS — M5416 Radiculopathy, lumbar region: Secondary | ICD-10-CM | POA: Diagnosis not present

## 2023-01-08 DIAGNOSIS — F331 Major depressive disorder, recurrent, moderate: Secondary | ICD-10-CM | POA: Diagnosis not present

## 2023-01-08 DIAGNOSIS — G43919 Migraine, unspecified, intractable, without status migrainosus: Secondary | ICD-10-CM | POA: Diagnosis not present

## 2023-02-15 DIAGNOSIS — E559 Vitamin D deficiency, unspecified: Secondary | ICD-10-CM | POA: Diagnosis not present

## 2023-02-15 DIAGNOSIS — E782 Mixed hyperlipidemia: Secondary | ICD-10-CM | POA: Diagnosis not present

## 2023-02-15 DIAGNOSIS — F900 Attention-deficit hyperactivity disorder, predominantly inattentive type: Secondary | ICD-10-CM | POA: Diagnosis not present

## 2023-02-15 DIAGNOSIS — R7303 Prediabetes: Secondary | ICD-10-CM | POA: Diagnosis not present

## 2023-02-15 DIAGNOSIS — N3941 Urge incontinence: Secondary | ICD-10-CM | POA: Diagnosis not present

## 2023-04-17 ENCOUNTER — Telehealth: Payer: Self-pay | Admitting: Psychiatry

## 2023-04-17 NOTE — Telephone Encounter (Signed)
I have not seen her in a year.  I cannot change meds for worsening sx without seeing her.  She can try to move up appt and be put on cancellation list.

## 2023-04-17 NOTE — Telephone Encounter (Signed)
Patient reports her PCP, Dr. Garner Nash, sends in RF for all her meds. She reports she is taking:  Adderall 30 mg BID Pristiq 100 mg Abilify 5 mg qd Wellbutrin 450 mg   Patient reporting increasing depression, no energy or motivation.  She did not FU in May. She reports husband has stage IV bone cancer, which is the stressor for the depression. She reports she can sleep all the time because when she sleeps she doesn't have to think about husband's health. She said she has no motivation to do anything, not even take a bath.  Copied from PMPD, as she is on several controlled medications.    04/05/2023 04/01/2023 1 Dextroamp-Amphetamin 30 Mg Tab 60.00 30 [place] [id number] Ede [phone]) 0/0  Medicare [place] 04/05/2023 04/05/2023 1  Oxycodone-Acetaminophen 5-325 120.00 30 Te Dan [id number] Ede (0252) 0/0 30.00 MME Medicare [place] 03/09/2023 01/08/2023 1  Dextroamp-Amphetamin 30 Mg Tab 60.00 30 Te Dan [id number] Ede [phone]) 0/0  Medicare [place] 02/25/2023 02/25/2023 1  Oxycodone-Acetaminophen 5-325 120.00 30 Te Dan [id number] Ede (0252) 0/0 30.00 MME Medicare [place] 02/07/2023 01/08/2023 1  Dextroamp-Amphetamin 30 Mg Tab 60.00 30 Te Dan [id number] Ede [phone]) 0/0  Medicare [place] 01/23/2023 01/08/2023 1  Oxycodone-Acetaminophen 5-325 120.00 30 Te Dan [id number] Ede (0252) 0/0 30.00 MME Medicare [place] 01/21/2023 08/30/2022 2  Pregabalin 300 Mg Capsule 180.00 90 Te Dan [id number] Cen (9851) 1/1 4.02 LME [place] 01/21/2023 08/03/2022 2  Clonazepam 0.5 Mg Tablet 90.00 30 Te Dan [id number] Cen (9851) 1/3 3.00 LME [place] 01/08/2023 01/08/2023 1  Dextroamp-Amphetamin 30 Mg Tab 60.00 30 Te Dan [id number] Ede [phone]) 0/0  Medicare [place] 12/18/2022 12/17/2022 1  Oxycodone-Acetaminophen 5-325 120.00 30 Te Dan [id number] Ede (0252) 0/0 30.00 MME Medicare [place] 12/18/2022 12/17/2022 1  Dextroamp-Amphetamin 20 Mg Tab 60.00 30 Te Dan [id number] Ede [phone]) 0/0  Medicare [place] 12/02/2022 09/10/2022 1  Phentermine 37.5 Mg Capsule 30.00 30 Te  Dan [id number] Wal (1262) 1/2  Medicare [place] 11/16/2022 11/16/2022 1  Oxycodone-Acetaminophen 5-325 120.00 30 Te Dan [id number] Ede (0252) 0/0 30.00 MME Medicare [place] 11/16/2022 11/16/2022 1  Dextroamp-Amphetamin 20 Mg Tab 60.00 30 Te Dan [id number] Ede [phone]) 0/0  Medicare [place] 10/17/2022 10/17/2022 1  Dextroamp-Amphetamin 20 Mg Tab 60.00 30 Te Dan [id number] Ede [phone])

## 2023-04-17 NOTE — Telephone Encounter (Signed)
Next appt is 05/28/23.Melanie Case says she has not been feeling well for six weeks. She has no energy, is tired a lot and is depressed. She's not sure if one of her medications could be causing this? Her phone number is [phone].  Pharmacy is:  [facility] - [place], [place] - [name]   Phone: [phone]  Fax: [phone]

## 2023-04-17 NOTE — Telephone Encounter (Signed)
Patient reporting her husband has stage IV bone cancer. She said she sleeps a lot because she doesn't have to think about it then. No motivation to do anything, not even take a bath. Tried to call her back because based on last RF for medications she has not been compliant with taking and was a no show for FU appt. She did not answer and mailbox is full. No changes have been made since last appointment.

## 2023-04-18 NOTE — Telephone Encounter (Signed)
Notified patient. Told her I put her on cancellation list. She said she will contact PCP.

## 2023-04-18 NOTE — Telephone Encounter (Signed)
She has been added to the wait list. 

## 2023-04-18 NOTE — Telephone Encounter (Signed)
Please see if Dr. Jennelle Human may have an earlier appt and if not put on the cancellation list.

## 2023-04-24 DIAGNOSIS — R7303 Prediabetes: Secondary | ICD-10-CM | POA: Diagnosis not present

## 2023-04-24 DIAGNOSIS — K219 Gastro-esophageal reflux disease without esophagitis: Secondary | ICD-10-CM | POA: Diagnosis not present

## 2023-04-24 DIAGNOSIS — E7849 Other hyperlipidemia: Secondary | ICD-10-CM | POA: Diagnosis not present

## 2023-04-24 DIAGNOSIS — Z1329 Encounter for screening for other suspected endocrine disorder: Secondary | ICD-10-CM | POA: Diagnosis not present

## 2023-04-24 DIAGNOSIS — D649 Anemia, unspecified: Secondary | ICD-10-CM | POA: Diagnosis not present

## 2023-04-29 DIAGNOSIS — E538 Deficiency of other specified B group vitamins: Secondary | ICD-10-CM | POA: Diagnosis not present

## 2023-04-29 DIAGNOSIS — I10 Essential (primary) hypertension: Secondary | ICD-10-CM | POA: Diagnosis not present

## 2023-04-29 DIAGNOSIS — G43919 Migraine, unspecified, intractable, without status migrainosus: Secondary | ICD-10-CM | POA: Diagnosis not present

## 2023-04-29 DIAGNOSIS — F900 Attention-deficit hyperactivity disorder, predominantly inattentive type: Secondary | ICD-10-CM | POA: Diagnosis not present

## 2023-04-29 DIAGNOSIS — M797 Fibromyalgia: Secondary | ICD-10-CM | POA: Diagnosis not present

## 2023-04-29 DIAGNOSIS — E8881 Metabolic syndrome: Secondary | ICD-10-CM | POA: Diagnosis not present

## 2023-04-29 DIAGNOSIS — R7303 Prediabetes: Secondary | ICD-10-CM | POA: Diagnosis not present

## 2023-04-29 DIAGNOSIS — E559 Vitamin D deficiency, unspecified: Secondary | ICD-10-CM | POA: Diagnosis not present

## 2023-04-29 DIAGNOSIS — M5416 Radiculopathy, lumbar region: Secondary | ICD-10-CM | POA: Diagnosis not present

## 2023-05-28 ENCOUNTER — Ambulatory Visit (INDEPENDENT_AMBULATORY_CARE_PROVIDER_SITE_OTHER): Payer: Medicare PPO | Admitting: Psychiatry

## 2023-05-28 ENCOUNTER — Encounter: Payer: Self-pay | Admitting: Psychiatry

## 2023-05-28 DIAGNOSIS — Z91199 Patient's noncompliance with other medical treatment and regimen due to unspecified reason: Secondary | ICD-10-CM

## 2023-05-28 NOTE — Progress Notes (Signed)
No show.  Sent warning.

## 2023-06-18 DIAGNOSIS — R7303 Prediabetes: Secondary | ICD-10-CM | POA: Diagnosis not present

## 2023-06-18 DIAGNOSIS — G43919 Migraine, unspecified, intractable, without status migrainosus: Secondary | ICD-10-CM | POA: Diagnosis not present

## 2023-06-18 DIAGNOSIS — F331 Major depressive disorder, recurrent, moderate: Secondary | ICD-10-CM | POA: Diagnosis not present

## 2023-06-18 DIAGNOSIS — E782 Mixed hyperlipidemia: Secondary | ICD-10-CM | POA: Diagnosis not present

## 2023-06-20 NOTE — Progress Notes (Deleted)
 NEUROLOGY FOLLOW UP OFFICE NOTE  Ileta P Amadi [id number]  Assessment/Plan:   Primary stabbing headache History of cerebral aneurysm, status post coling.  Migraine without aura, without status migrainosus, not intractable - stable   Primary stabbing headache management:  melatonin 10mg  at bedtime *** Migraine treatment as managed by PCP:  Emgality, ibuprofen, Zofran  ODT 4mg  ***       Subjective:  Kerianne P Ribera is a 58 year old right-handed female with HTN, ADHD, chronic low back pain, fibromyalgia, HLD, sleep apnea and history of cerebral aneurysm s/p coiling who follows up for headaches.  UPDATE: For new stabbing headaches, she had a CTA of the head on 12/12/2022 which revealed prior left MCA aneurysm coiling but no definite new or recurrent aneurysms.    She was advised to start melatonin 10mg  at bedtime. ***  Current NSAIDS/analgesics:  Motrin, percocet Current triptans:  none Current ergotamine:  none Current anti-emetic:  Zofran  ODT 4mg  Current muscle relaxants:  none Current Antihypertensive medications:  atenolol, losartan Current Antidepressant medications:  desvenlafaxine , Wellbutrin , sertraline 100mg  daily Current Anticonvulsant medications: gabapentin 300mg  BID, Lyrica 300mg  BID Current anti-CGRP:  Emgality Current Vitamins/Herbal/Supplements:  melatonin 10mg  at bedtime *** Current Antihistamines/Decongestants:  loratidine Other therapy:  none Other medications:  Klonopin TID PRN, Adderall, Abilfy   HISTORY: History of migraines since her 69s.  Typical migraines are severe bifrontal/back of head (but may be diffuse) pounding/stabbing pain.  They are associated nausea, photophobia, phonophobia, and possible blurred vision.  Typically last a day but sometimes into next day.  However, knocks out quickly with  ibuprofen.  Occur 1 to 2 times a week.  Bright light, strenuous activity and change in weather are triggers.    She started having a new headache in  May 2024.  It is a severe bolt of lightening just left of vertex lasting 30 seconds.  They occur several times a day.  Associated with photophobia but no visual disturbance, nausea, vomiting, numbness or weakness.  Occurs spontaneously, even may wake her up at night out of sleep.  She is under a lot of stress.  She has stage 4 cancer.    She has history of cerebral aneurysm s/p coiling in 2004.  Presented as more severe headache with vomiting in association with nausea.  Most recent imaging was an MRI of brain without contrast on 09/04/2017 demonstrated left MCA coiling stable compared to prior studies, as well as small hyperintense focus in left occipital parietal white matter, stable from 2009.      Past NSAIDS/analgesics:  Excedrin, Tyenol Past abortive triptans:  none Past abortive ergotamine:  none Past muscle relaxants:  none Past anti-emetic:  none Past antihypertensive medications:  none Past antidepressant medications:  amitriptyline, sertraline Past anticonvulsant medications:  topiramate Past anti-CGRP:  Emgality, Ajovy Past vitamins/Herbal/Supplements:  none Past antihistamines/decongestants:  none Other past therapies:  none   Caffeine:  No coffee.  Drinks decaf tea.   Diet:  Erin ale, needs to increase water intake.   Exercise:  no due to pain Depression:  yes; Anxiety:  yes Other pain:  Chronic low back pain, fibromyalgia.  On chronic opioid use. Sleep hygiene:  OSA.  PAST MEDICAL HISTORY: Past Medical History:  Diagnosis Date   ADHD (attention deficit hyperactivity disorder)    Depression    Fibromyalgia    GERD (gastroesophageal reflux disease)    Hyperlipidemia    Hypertension    Long term (current) use of opiate analgesic    Metabolic syndrome  Migraines    Obesity    Sleep apnea     MEDICATIONS: Current Outpatient Medications on File Prior to Visit  Medication Sig Dispense Refill   amphetamine -dextroamphetamine  (ADDERALL) 20 MG tablet Take 1  tablet (20 mg total) by mouth 2 (two) times daily. 60 tablet 0   ARIPiprazole  (ABILIFY ) 5 MG tablet Take 1 tablet (5 mg total) by mouth daily. 90 tablet 1   atenolol (TENORMIN) 50 MG tablet Take 50 mg by mouth daily.     buPROPion  (WELLBUTRIN  XL) 150 MG 24 hr tablet Take 3 tablets (450 mg total) by mouth daily. 270 tablet 1   clonazePAM (KLONOPIN) 0.5 MG tablet Take 0.5 mg by mouth 3 (three) times daily as needed for anxiety.     desvenlafaxine  (PRISTIQ ) 100 MG 24 hr tablet Take 1 tablet (100 mg total) by mouth daily. 90 tablet 1   EMGALITY 120 MG/ML SOAJ Inject into the skin.     gabapentin (NEURONTIN) 300 MG capsule Take 300 mg by mouth 2 (two) times daily.     ibuprofen (ADVIL,MOTRIN) 200 MG tablet Take 200 mg by mouth 2 (two) times daily.     loratadine (CLARITIN) 10 MG tablet Take 10 mg by mouth daily.     losartan (COZAAR) 25 MG tablet Take 2 tablets by mouth daily.     metFORMIN (GLUCOPHAGE) 500 MG tablet Take 500 mg by mouth 2 (two) times daily.     omeprazole (PRILOSEC) 40 MG capsule Take 40 mg by mouth daily.  4   ondansetron  (ZOFRAN  ODT) 4 MG disintegrating tablet Take 1 tablet (4 mg total) by mouth every 8 (eight) hours as needed for nausea or vomiting. 20 tablet 5   oxybutynin (DITROPAN-XL) 5 MG 24 hr tablet Take 5 mg by mouth daily.     oxyCODONE-acetaminophen  (PERCOCET/ROXICET) 5-325 MG tablet Take 1 tablet by mouth 4 (four) times daily.     pregabalin (LYRICA) 300 MG capsule Take 300 mg by mouth 2 (two) times daily.     rosuvastatin (CRESTOR) 10 MG tablet Take 10 mg by mouth at bedtime.     sertraline (ZOLOFT) 100 MG tablet Take 100 mg by mouth daily.     triamcinolone  cream (KENALOG ) 0.1 % Apply 1 application topically 3 (three) times daily.     No current facility-administered medications on file prior to visit.    ALLERGIES: No Known Allergies  FAMILY HISTORY: Family History  Problem Relation Age of Onset   Hypertension Brother    CAD Brother    Heart attack Mother     Kidney disease Mother       Objective:  *** General: No acute distress.  Patient appears well-groomed.   Head:  Normocephalic/atraumatic Eyes:  Fundi examined but not visualized Neck: supple, no paraspinal tenderness, full range of motion Heart:  Regular rate and rhythm Neurological Exam: ***   Juliene Dunnings, DO  CC: Jerel Sieving, MD

## 2023-06-21 ENCOUNTER — Ambulatory Visit: Payer: Medicare PPO | Admitting: Neurology

## 2023-06-21 ENCOUNTER — Encounter: Payer: Self-pay | Admitting: Neurology

## 2023-07-22 DIAGNOSIS — Z1231 Encounter for screening mammogram for malignant neoplasm of breast: Secondary | ICD-10-CM | POA: Diagnosis not present

## 2023-08-12 DIAGNOSIS — Z6832 Body mass index (BMI) 32.0-32.9, adult: Secondary | ICD-10-CM | POA: Diagnosis not present

## 2023-08-12 DIAGNOSIS — M25511 Pain in right shoulder: Secondary | ICD-10-CM | POA: Diagnosis not present

## 2023-08-22 DIAGNOSIS — E782 Mixed hyperlipidemia: Secondary | ICD-10-CM | POA: Diagnosis not present

## 2023-08-22 DIAGNOSIS — Z1329 Encounter for screening for other suspected endocrine disorder: Secondary | ICD-10-CM | POA: Diagnosis not present

## 2023-08-22 DIAGNOSIS — E7849 Other hyperlipidemia: Secondary | ICD-10-CM | POA: Diagnosis not present

## 2023-08-22 DIAGNOSIS — R7303 Prediabetes: Secondary | ICD-10-CM | POA: Diagnosis not present

## 2023-08-22 DIAGNOSIS — K219 Gastro-esophageal reflux disease without esophagitis: Secondary | ICD-10-CM | POA: Diagnosis not present

## 2023-08-28 DIAGNOSIS — E782 Mixed hyperlipidemia: Secondary | ICD-10-CM | POA: Diagnosis not present

## 2023-08-28 DIAGNOSIS — E7849 Other hyperlipidemia: Secondary | ICD-10-CM | POA: Diagnosis not present

## 2023-08-28 DIAGNOSIS — K2100 Gastro-esophageal reflux disease with esophagitis, without bleeding: Secondary | ICD-10-CM | POA: Diagnosis not present

## 2023-08-28 DIAGNOSIS — Z6832 Body mass index (BMI) 32.0-32.9, adult: Secondary | ICD-10-CM | POA: Diagnosis not present

## 2023-08-28 DIAGNOSIS — Z79891 Long term (current) use of opiate analgesic: Secondary | ICD-10-CM | POA: Diagnosis not present

## 2023-08-28 DIAGNOSIS — F900 Attention-deficit hyperactivity disorder, predominantly inattentive type: Secondary | ICD-10-CM | POA: Diagnosis not present

## 2023-09-03 DIAGNOSIS — R9082 White matter disease, unspecified: Secondary | ICD-10-CM | POA: Diagnosis not present

## 2023-09-03 DIAGNOSIS — N390 Urinary tract infection, site not specified: Secondary | ICD-10-CM | POA: Diagnosis not present

## 2023-09-03 DIAGNOSIS — F339 Major depressive disorder, recurrent, unspecified: Secondary | ICD-10-CM | POA: Diagnosis not present

## 2023-09-03 DIAGNOSIS — Z8679 Personal history of other diseases of the circulatory system: Secondary | ICD-10-CM | POA: Diagnosis not present

## 2023-09-03 DIAGNOSIS — F061 Catatonic disorder due to known physiological condition: Secondary | ICD-10-CM | POA: Diagnosis not present

## 2023-09-03 DIAGNOSIS — F419 Anxiety disorder, unspecified: Secondary | ICD-10-CM | POA: Diagnosis not present

## 2023-09-03 DIAGNOSIS — F05 Delirium due to known physiological condition: Secondary | ICD-10-CM | POA: Diagnosis not present

## 2023-09-03 DIAGNOSIS — F32A Depression, unspecified: Secondary | ICD-10-CM | POA: Diagnosis not present

## 2023-09-03 DIAGNOSIS — F17210 Nicotine dependence, cigarettes, uncomplicated: Secondary | ICD-10-CM | POA: Diagnosis not present

## 2023-09-03 DIAGNOSIS — R4182 Altered mental status, unspecified: Secondary | ICD-10-CM | POA: Diagnosis not present

## 2023-09-03 DIAGNOSIS — R410 Disorientation, unspecified: Secondary | ICD-10-CM | POA: Diagnosis not present

## 2023-09-03 DIAGNOSIS — F323 Major depressive disorder, single episode, severe with psychotic features: Secondary | ICD-10-CM | POA: Diagnosis not present

## 2023-09-03 DIAGNOSIS — G43909 Migraine, unspecified, not intractable, without status migrainosus: Secondary | ICD-10-CM | POA: Diagnosis not present

## 2023-09-03 DIAGNOSIS — M797 Fibromyalgia: Secondary | ICD-10-CM | POA: Diagnosis not present

## 2023-09-03 DIAGNOSIS — F202 Catatonic schizophrenia: Secondary | ICD-10-CM | POA: Diagnosis not present

## 2023-09-03 DIAGNOSIS — R0989 Other specified symptoms and signs involving the circulatory and respiratory systems: Secondary | ICD-10-CM | POA: Diagnosis not present

## 2023-09-03 DIAGNOSIS — I10 Essential (primary) hypertension: Secondary | ICD-10-CM | POA: Diagnosis not present

## 2023-09-04 DIAGNOSIS — G9341 Metabolic encephalopathy: Secondary | ICD-10-CM | POA: Diagnosis not present

## 2023-09-04 DIAGNOSIS — F323 Major depressive disorder, single episode, severe with psychotic features: Secondary | ICD-10-CM | POA: Diagnosis not present

## 2023-09-04 DIAGNOSIS — R4182 Altered mental status, unspecified: Secondary | ICD-10-CM | POA: Diagnosis not present

## 2023-09-04 DIAGNOSIS — R9082 White matter disease, unspecified: Secondary | ICD-10-CM | POA: Diagnosis not present

## 2023-09-04 DIAGNOSIS — Z8679 Personal history of other diseases of the circulatory system: Secondary | ICD-10-CM | POA: Diagnosis not present

## 2023-09-04 DIAGNOSIS — F061 Catatonic disorder due to known physiological condition: Secondary | ICD-10-CM | POA: Diagnosis not present

## 2023-09-05 DIAGNOSIS — F05 Delirium due to known physiological condition: Secondary | ICD-10-CM | POA: Diagnosis not present

## 2023-09-05 DIAGNOSIS — F32A Depression, unspecified: Secondary | ICD-10-CM | POA: Diagnosis not present

## 2023-09-05 IMAGING — MR MR LUMBAR SPINE W/O CM
4 of 5 series · 26 of 48 positions shown · non-contrast
Comparison: None.

CLINICAL DATA: Low back pain.  Bilateral leg pain for 1 year

EXAM:
MRI LUMBAR SPINE WITHOUT CONTRAST
TECHNIQUE: Multiplanar, multisequence MR imaging of the lumbar spine was
performed. No intravenous contrast was administered.

[Series 2: T2 · sagittal · 4.0mm · 0.59mm/px · 6 of 15 slices shown (1 of 2)]
[im 1/15]
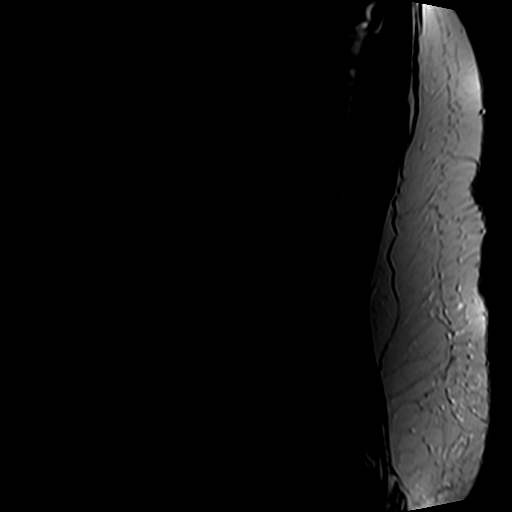
[im 3/15]
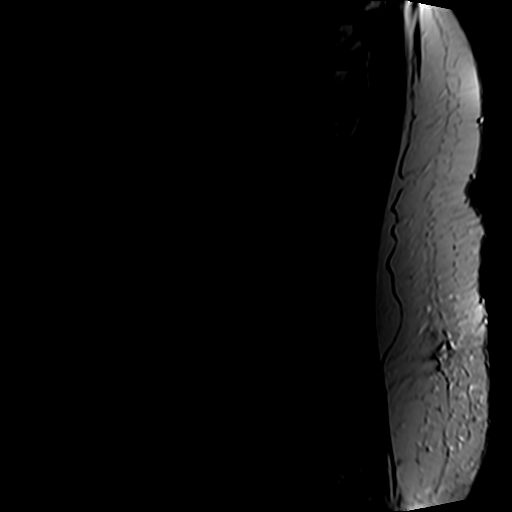
[im 6/15]
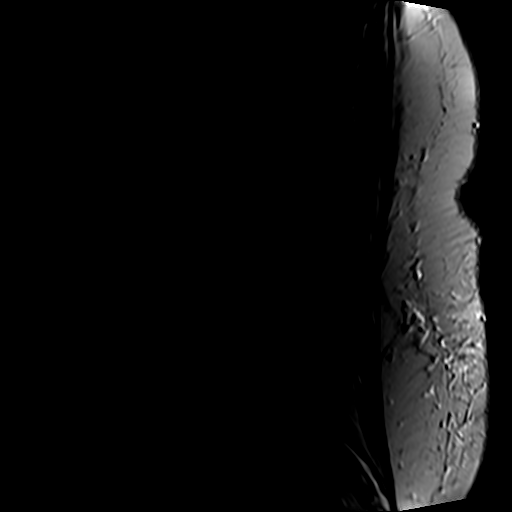
[im 9/15]
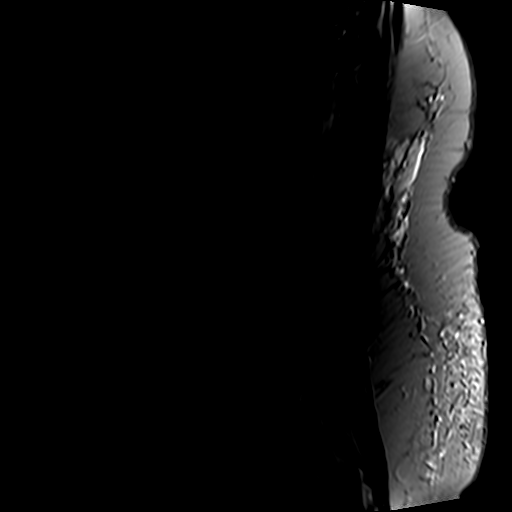
[im 12/15]
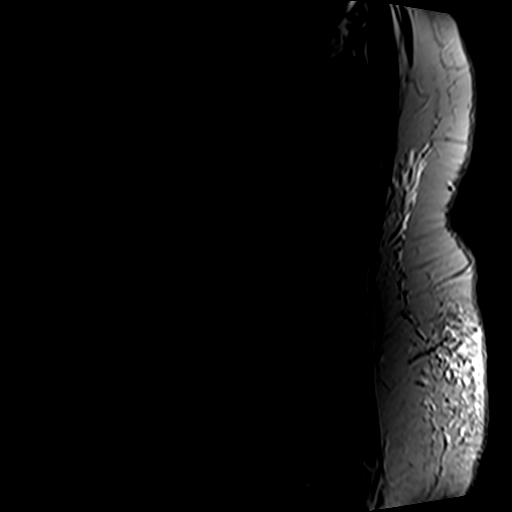
[im 15/15]
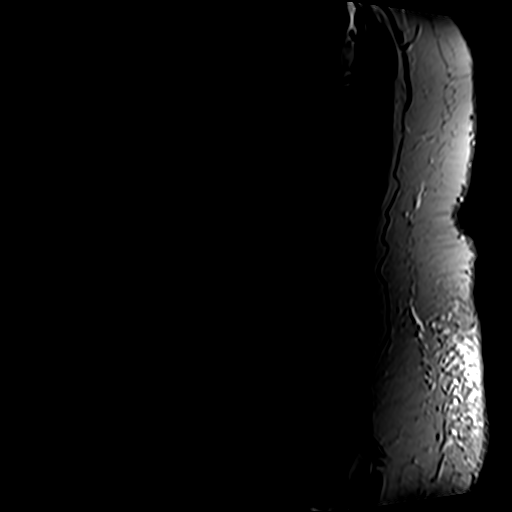

[Series 4: T1 · sagittal · 4.0mm · 0.59mm/px · 6 of 15 slices shown (1 of 2)]
[im 1/15]
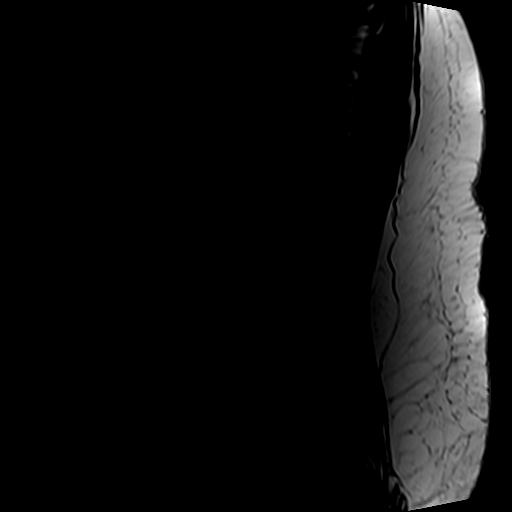
[im 3/15]
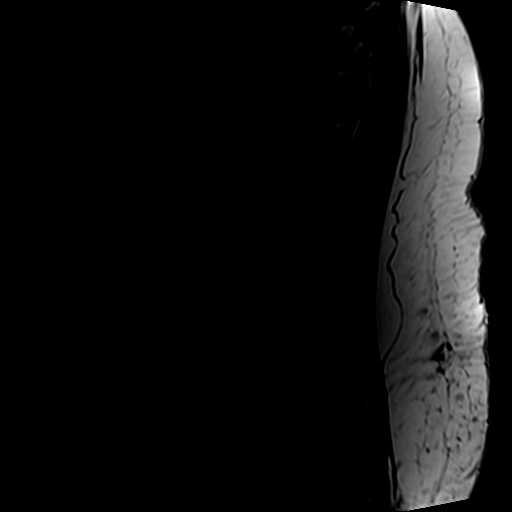
[im 6/15]
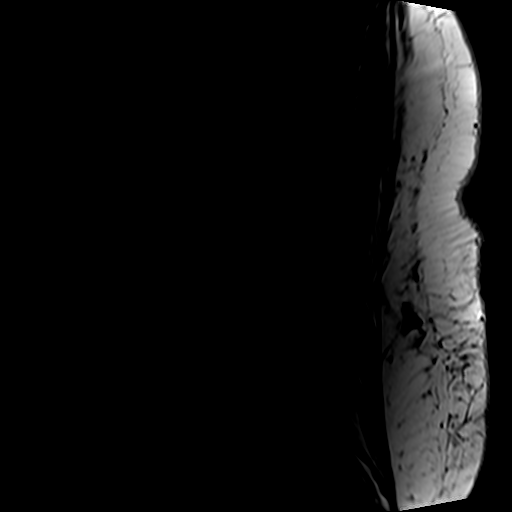
[im 9/15]
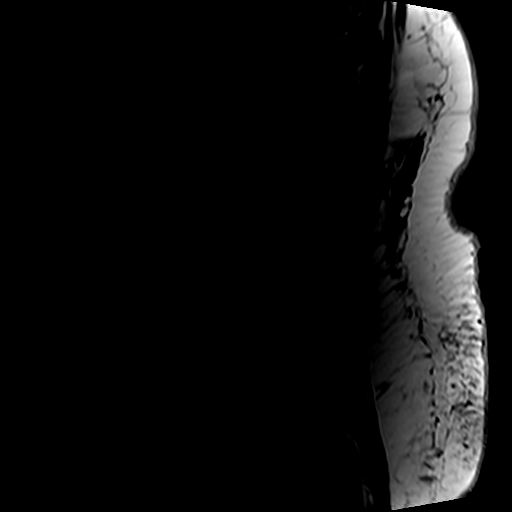
[im 12/15]
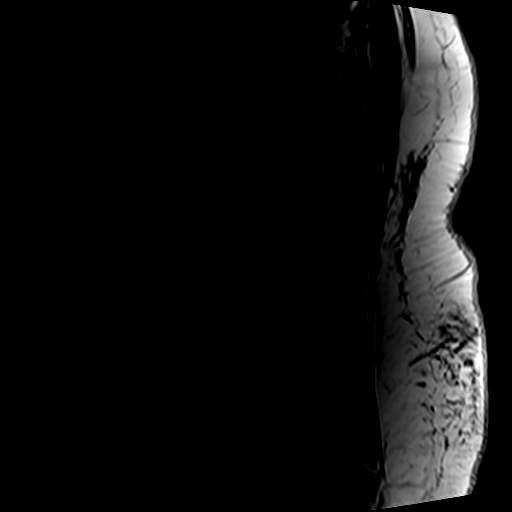
[im 15/15]
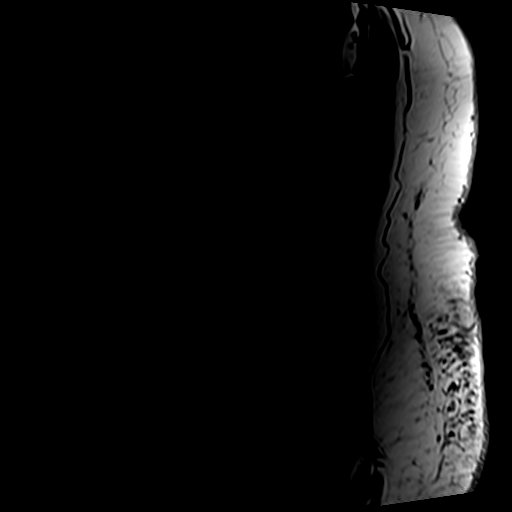

[Series 5: T2 · axial · 4.0mm · 0.70mm/px · z∈[-81,+114]mm · 9 of 35 slices shown (2 of 2)]
[im 1/35]
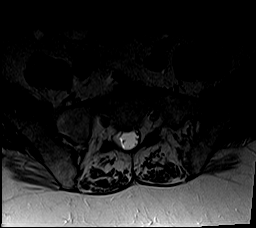
[im 5/35]
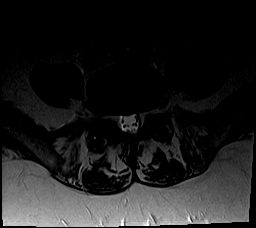
[im 10/35]
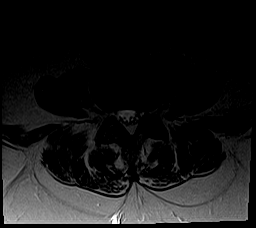
[im 15/35]
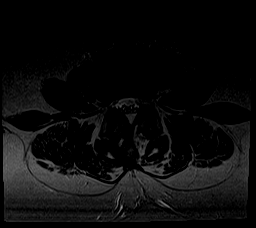
[im 18/35]
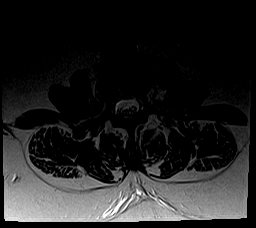
[im 20/35]
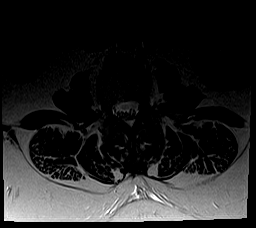
[im 25/35]
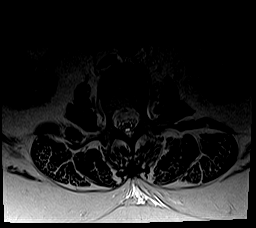
[im 30/35]
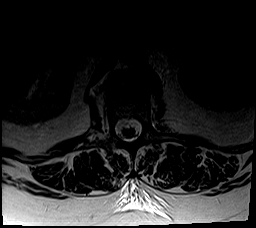
[im 35/35]
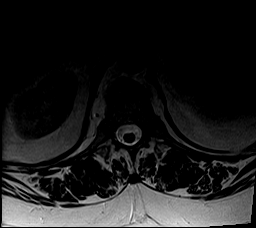

[Series 6: T1 · axial · 4.0mm · 0.35mm/px · z∈[-81,+88]mm · 5 of 35 slices shown (2 of 2)]
[im 1/35]
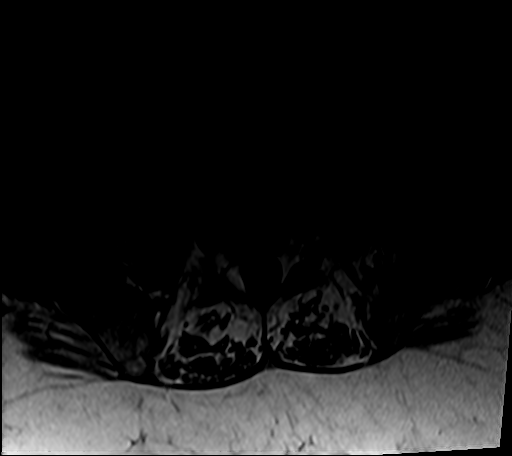
[im 5/35]
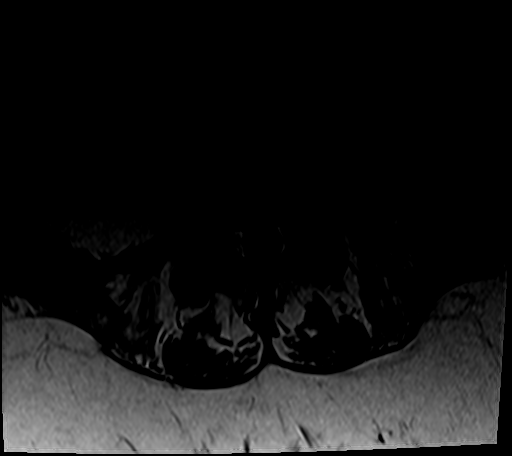
[im 10/35]
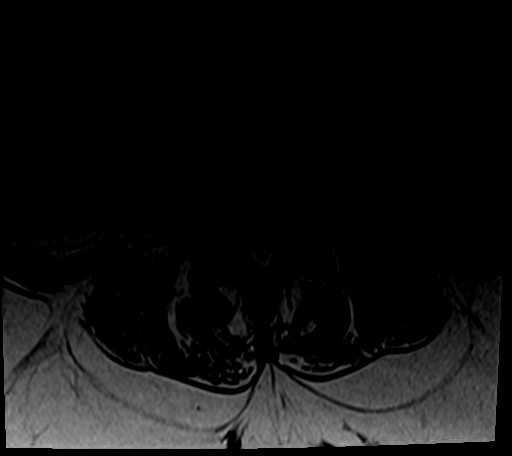
[im 18/35]
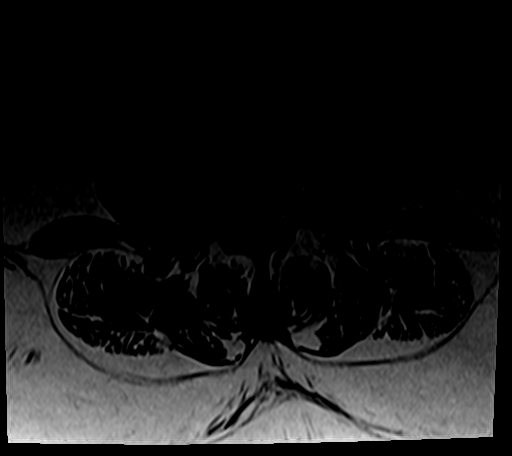
[im 30/35]
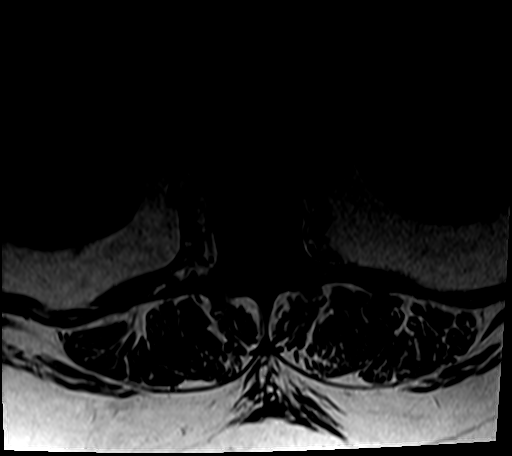

[26 of 48 positions shown; findings below may reference images not displayed]

FINDINGS: Segmentation:  5 lumbar type vertebrae

Alignment:  Mild degenerative anterolisthesis at L5-S1.

Vertebrae:  Marrow edema at the right more than left L5-S1 facets.

Conus medullaris and cauda equina: Conus extends to the L2 level.
Conus and cauda equina appear normal.

Paraspinal and other soft tissues: Negative for perispinal mass or
inflammation.

Disc levels:

T11-12: Central protrusion and bilateral facet spurring. No neural
compression.

T12- L1: Minimal ventral spondylosis

L1-L2: Mild disc narrowing and bulging with shallow central
protrusion.

L2-L3: Mild spondylosis and disc bulging

L3-L4: Mild facet spurring

L4-L5: Mild disc bulging and facet spurring.

L5-S1:Facet osteoarthritis with spurring on the right more than left
where there is also marrow edema. Asymmetric right foraminal disc
bulging. Right foraminal impingement.
IMPRESSION: 1. Lumbar spine degeneration most notably affecting the L5-S1 facets
with right more than left marrow edema and spurring.
2. L5-S1 right foraminal impingement.
3. Diffusely patent spinal canal.

## 2023-09-17 ENCOUNTER — Encounter: Payer: Self-pay | Admitting: Psychiatry

## 2023-09-17 ENCOUNTER — Ambulatory Visit: Admitting: Psychiatry

## 2023-09-17 DIAGNOSIS — F4323 Adjustment disorder with mixed anxiety and depressed mood: Secondary | ICD-10-CM | POA: Diagnosis not present

## 2023-09-17 DIAGNOSIS — F900 Attention-deficit hyperactivity disorder, predominantly inattentive type: Secondary | ICD-10-CM

## 2023-09-17 DIAGNOSIS — F331 Major depressive disorder, recurrent, moderate: Secondary | ICD-10-CM

## 2023-09-17 DIAGNOSIS — F902 Attention-deficit hyperactivity disorder, combined type: Secondary | ICD-10-CM

## 2023-09-17 NOTE — Progress Notes (Signed)
 Melanie Case [id number] 1966/06/14 58 y.o.   Subjective:   Patient ID:  Melanie Case is a 58 y.o. (DOB April 30, 1966) female.  Chief Complaint:  Chief Complaint  Patient presents with   Follow-up   Depression   Anxiety    HPI Melanie Case presents to the office today for follow-up of TRD. 04/2018 appt noted; Very sensitive emotionally and always has been that way.  Sister moved back and is stressful to deal with. Trying to do more projects in the house.  First time in 5 years she'll have company for the holidays DT depression. Pt reports that mood is Anxious and stressed with sister.  and describes anxiety as Moderate. Anxiety symptoms include: Excessive Worry,. Pt reports no sleep issues. Pt reports that appetite is good. Pt reports that energy is good and good. Concentration is good. Suicidal thoughts:  denied by patient.  Energy fair DT FM.  Slow in am.  Has had anxiety about driving and just started driving around town, not here. Excited about the holidays for the first time in year.s. She does not want changes. On Adderall off and on from PCP for focus.  Not sure when she first started stimulants for focus, but for years when she was working DT distractibility. Plan no med changes  03/22/2021 appointment with the following noted: As noted the patient has not been seen in 3 years. 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 D 30 lives with her.  Going on for about 3 years. Gets angry and about to explode.  Constantly having to worry about her.   Hesitates to take clonazepam 0.5 bc makes her tired.  Only takes at night.  Doesn't want to go anywhere.  Hx winter depression.  Let herself gain weight and doesn't want anyone to see her.  Not using makeup and ashamed of herself.  Gained weight. Adderall doesn't stimulate her but does focus her. Plan: To help anger and depression and  Start Abilify 5 mg daily Reduce the Wellbutrin to 2 tablets daily for 2 weeks, Then reduce  Wellbutrin to 1 daily for 2 weeks, Then stop Wellbutrin Continue Pristiq 100  06/01/2021 appointment with the following noted: Out of a fog now.  Much more awake.  Still not a lot of interest in getting up and going out. Needs more motivation to go out in public.  No crying.  No anger outbursts or irritable.  Everybody can tell a difference in same way.   Ozempic started by constipation Plans to start water aerobics Less depression but still feels bad about her weight.  09/14/21 appt noted: Not good.  More anxious in the evening.  She felet worse off the Wellbutrin specifically more depressed.  More weepy off Wellbutrin   Started taking Wellbutrin XL 300 Taking Pristiq 100 mg daily.  No sign clonzaepam bc doesn't want to get slower. Abilify had helped but stopped when started Wellbutrin.   Taking Adderall 20 BID but still low energy.  Without Adderall mind jumps from one task to another. Trouble taking a shower.   Sleeping too much . 9 hours night and 4 hours naps daytime. No other triggers for depression except seasonal depression could tell this year more than usually Not angry No SE Lost 20# with [place]. Plan: To help depression and  Restart Abilify 5 mg daily clearly helpful Continue Wellbutrin 300 bc worse off it. Continue Pristiq 100 mg daily Continue Adderall 20 mg BID  11/07/2021 appointment with the following  noted: Taking meds as above. Little clonazepam bc sleepy. Biggest problem is low motivation.  depression and anxiety improved on Abilify and less on edge, agitated.  Mood is better.  Can find enjoyment socially but if alone it's a problem.  Hard when you dont have an identity bc not working. No SE Too much sleep still as noted above. Plan: To help depression and  Continue Abilify 5 mg daily clearly helpful Continue Pristiq 100 mg daily Continue Adderall 20 mg BID Increase Wellbutrin to help motivation and productivity to 450 mg daily.  04/30/2022 appointment  noted: Was feeling better but H cas cancer Stage 4 prostate and in his bones.  Found out in August.  PSA was not elevated.  Doing best she can with that.  He's on IV chemo.  His pain is controlled with meds. Up and down at night checking on him.  He still works other than chemo week.  Gave him prognosis of 1-5 years.  He's handling it better than she. No problems with psych meds.  Uses pill box.   Some anxiety and depression depending on where her mind is at the moment.  Situational. Probably increase dose helped.   No med changes.  09/03/23 hosp delirium .  MRI and CT of head normal.  09/17/23 appt noted:  NS twice in 2024 Lives outside [place] .   Says final dx with hosp delirium was urosepsis. Stayed on meds and PCP filled RF.   Consistent Meds : Abilify 5, Wellbutrin XL 450, Pristiq 100, pregabalin for leg pain,  Lyrica 300 BID, oxycodone BID,  stayed on same meds. SE  positional tremor and constipation.   Don't get weepy like before.  Hate to leave the house.  Still driving phobia.   H stage 4 prostate CA in bones.  Prognosis a couple of years.  D recovering addict with mulptile relapses.  Lives with her over the last year.   2 sisters left.   Stressors drag her down like fear of H's death.   Feels like needs to talk to someone bc no one to talk to.    Need someone to tell me I'm normal.   No panic in years but used to be bad.  Will get in her head I can't drive but no reason for it.  H brought her here.  H good to her.  Rarely to church.  Will deive herself to [employer] if H goes with her.  Feel safe at home.  Adderall only gives her focus not energy.    Involved in projects at home and enjoys adult coloring books and video games and likes to cook.   1 friend mainly talk on phone.  She works a lot.  Worries about leaving H at home.     Past Psychiatric Medication Trials: Wellbutrin 450 Pristiq 100 Abilify 5 Adderall 20 BID   Review of Systems:  Review of Systems   Gastrointestinal:  Positive for constipation. Negative for abdominal pain.  Musculoskeletal:  Positive for arthralgias.  Neurological:  Negative for tremors.  Psychiatric/Behavioral:  Positive for dysphoric mood. Negative for agitation, behavioral problems, confusion, decreased concentration, hallucinations, self-injury, sleep disturbance and suicidal ideas. The patient is nervous/anxious. The patient is not hyperactive.     Medications: I have reviewed the patient's current medications.  Current Outpatient Medications  Medication Sig Dispense Refill   amphetamine-dextroamphetamine (ADDERALL) 20 MG tablet Take 1 tablet (20 mg total) by mouth 2 (two) times daily. (Patient taking differently: Take 30 mg  by mouth 2 (two) times daily.) 60 tablet 0   ARIPiprazole (ABILIFY) 5 MG tablet Take 1 tablet (5 mg total) by mouth daily. 90 tablet 1   atenolol (TENORMIN) 50 MG tablet Take 50 mg by mouth daily.     buPROPion (WELLBUTRIN XL) 150 MG 24 hr tablet Take 3 tablets (450 mg total) by mouth daily. 270 tablet 1   clonazePAM (KLONOPIN) 0.5 MG tablet Take 0.5 mg by mouth 3 (three) times daily as needed for anxiety.     desvenlafaxine (PRISTIQ) 100 MG 24 hr tablet Take 1 tablet (100 mg total) by mouth daily. 90 tablet 1   EMGALITY 120 MG/ML SOAJ Inject into the skin.     ibuprofen (ADVIL,MOTRIN) 200 MG tablet Take 200 mg by mouth 2 (two) times daily.     loratadine (CLARITIN) 10 MG tablet Take 10 mg by mouth daily.     losartan (COZAAR) 25 MG tablet Take 2 tablets by mouth daily.     metFORMIN (GLUCOPHAGE) 500 MG tablet Take 500 mg by mouth 2 (two) times daily.     omeprazole (PRILOSEC) 40 MG capsule Take 40 mg by mouth daily.  4   ondansetron (ZOFRAN ODT) 4 MG disintegrating tablet Take 1 tablet (4 mg total) by mouth every 8 (eight) hours as needed for nausea or vomiting. 20 tablet 5   oxybutynin (DITROPAN-XL) 5 MG 24 hr tablet Take 5 mg by mouth daily.     oxyCODONE-acetaminophen (PERCOCET/ROXICET)  5-325 MG tablet Take 1 tablet by mouth 4 (four) times daily.     pregabalin (LYRICA) 300 MG capsule Take 300 mg by mouth 2 (two) times daily.     rosuvastatin (CRESTOR) 10 MG tablet Take 10 mg by mouth at bedtime.     triamcinolone cream (KENALOG) 0.1 % Apply 1 application topically 3 (three) times daily.     sertraline (ZOLOFT) 100 MG tablet Take 100 mg by mouth daily. (Patient not taking: Reported on 09/17/2023)     No current facility-administered medications for this visit.    Medication Side Effects: None  Allergies: No Known Allergies  Past Medical History:  Diagnosis Date   ADHD (attention deficit hyperactivity disorder)    Depression    Fibromyalgia    GERD (gastroesophageal reflux disease)    Hyperlipidemia    Hypertension    Long term (current) use of opiate analgesic    Metabolic syndrome    Migraines    Obesity    Sleep apnea     Family History  Problem Relation Age of Onset   Hypertension Brother    CAD Brother    Heart attack Mother    Kidney disease Mother     Social History   Socioeconomic History   Marital status: Married    Spouse name: Not on file   Number of children: Not on file   Years of education: Not on file   Highest education level: Not on file  Occupational History   Not on file  Tobacco Use   Smoking status: Former    Current packs/day: 0.00    Types: Cigarettes    Quit date: 06/18/2001    Years since quitting: 22.2   Smokeless tobacco: Never  Substance and Sexual Activity   Alcohol use: Yes    Comment: occasional   Drug use: Never   Sexual activity: Not on file  Other Topics Concern   Not on file  Social History Narrative   Not on file   Social Drivers of  Health   Financial Resource Strain: Not on file  Food Insecurity: No Food Insecurity (06/27/2020)   [facility], [facility]   Hunger Vital Sign    Worried About Running Out of Food in the Last Year: Never true    Ran Out of Food in the Last Year: Never  true  Transportation Needs: Not on file  Physical Activity: Not on file  Stress: Not on file  Social Connections: Unknown (10/29/2021)   Received from [facility], [facility]   Social Network    Social Network: Not on file  Intimate Partner Violence: Unknown (09/20/2021)   Received from [facility], [facility]   HITS    Physically Hurt: Not on file    Insult or Talk Down To: Not on file    Threaten Physical Harm: Not on file    Scream or Curse: Not on file    Past Medical History, Surgical history, Social history, and Family history were reviewed and updated as appropriate.   Recent normal card stress test and colonoscopy.  Please see review of systems for further details on the patient's review from today.   Objective:   Physical Exam:  LMP 04/18/2017 (Approximate)   Physical Exam Constitutional:      General: She is not in acute distress.    Appearance: She is well-developed.  Musculoskeletal:        General: No deformity.  Neurological:     Mental Status: She is alert and oriented to person, place, and time.     Motor: No tremor.     Coordination: Coordination normal.     Gait: Gait normal.  Psychiatric:        Attention and Perception: Perception normal. She is inattentive.        Mood and Affect: Mood is anxious and depressed. Affect is not labile, blunt, angry, tearful or inappropriate.        Speech: Speech normal.        Behavior: Behavior normal.        Thought Content: Thought content normal. Thought content is not delusional. Thought content does not include homicidal or suicidal ideation. Thought content does not include suicidal plan.        Cognition and Memory: Cognition and memory normal.        Judgment: Judgment normal.     Comments: Insight intact. No auditory or visual hallucinations. No delusions.  Fidgety for years but not severe.       Lab Review:     Component Value Date/Time   BUN 11 10/04/2010 1620   CREATININE 0.85 10/04/2010  1620   GFRNONAA >60 10/04/2010 1620   GFRAA  10/04/2010 1620    >60        The eGFR has been calculated using the MDRD equation. This calculation has not been validated in all clinical situations. eGFR's persistently <60 mL/min signify possible Chronic Kidney Disease.    No results found for: "WBC", "RBC", "HGB", "HCT", "PLT", "MCV", "MCH", "MCHC", "RDW", "LYMPHSABS", "MONOABS", "EOSABS", "BASOSABS"  No results found for: "POCLITH", "LITHIUM"   No results found for: "PHENYTOIN", "PHENOBARB", "VALPROATE", "CBMZ"   .res Assessment: Plan:    Major depressive disorder, recurrent episode, moderate (HCC)  Attention deficit hyperactivity disorder (ADHD), combined type  Attention deficit hyperactivity disorder (ADHD), predominantly inattentive type  Situational mixed anxiety and depressive disorder  40 min face to face time with patient was spent on counseling and coordination of care. We discussed hx TRD.  Consider lamotrigine bc hx cycling depression with seasonal pattern Depression and anxiety were better with Abilify but not gone but situation is creating some emotional problems relate to H's stage 4  cancer.  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  Also sz risk with the combo of wellbutrin and stimulants.  She seems to be benefitting and tolerating.    Disc progressing in dealing with driving phobias.  Counseling  20 min: multiple stressors H terminal CA, D Heroin addict.  Disc rec more socialization.  Not many friends.  Option volunteer work or PT work to [employer].   Constipation management 1.  Lots of water 2.  Powdered fiber supplement such as MiraLAX, Citrucel, etc. preferably with a meal 3.  2 stool softeners a day 4.  Milk of magnesia or magnesium tablets if needed  To help depression and  Continue Abilify 5 mg daily clearly helpful Discussed potential metabolic side effects  associated with atypical antipsychotics, as well as potential risk for movement side effects. Advised pt to contact office if movement side effects occur.   Continue Pristiq 100 mg daily Continue Adderall 30 mg BID  Continue Wellbutrin to help motivation and productivity to 450 mg daily.  Discussed the risk of adrenergic side effects given the med combination.  However she seems to need it present.  Discussed the risk of high blood pressure.  She will monitor.  Refer for counseling either her church, [facility], [place]  No med changes indicated  FU 4-6 mos  .Meredith Staggers, Md, DFAPA   Future Appointments  Date Time Provider Department Center  11/21/2023  2:00 PM Cottle, Steva Ready., MD [facility] None     No orders of the defined types were placed in this encounter.     -------------------------------

## 2023-09-17 NOTE — Patient Instructions (Signed)
 Melanie Case Or [facility]

## 2023-10-01 DIAGNOSIS — K2100 Gastro-esophageal reflux disease with esophagitis, without bleeding: Secondary | ICD-10-CM | POA: Diagnosis not present

## 2023-10-01 DIAGNOSIS — Z1331 Encounter for screening for depression: Secondary | ICD-10-CM | POA: Diagnosis not present

## 2023-10-01 DIAGNOSIS — E7849 Other hyperlipidemia: Secondary | ICD-10-CM | POA: Diagnosis not present

## 2023-10-01 DIAGNOSIS — Z79891 Long term (current) use of opiate analgesic: Secondary | ICD-10-CM | POA: Diagnosis not present

## 2023-10-01 DIAGNOSIS — Z0000 Encounter for general adult medical examination without abnormal findings: Secondary | ICD-10-CM | POA: Diagnosis not present

## 2023-10-01 DIAGNOSIS — Z0001 Encounter for general adult medical examination with abnormal findings: Secondary | ICD-10-CM | POA: Diagnosis not present

## 2023-10-01 DIAGNOSIS — Z1389 Encounter for screening for other disorder: Secondary | ICD-10-CM | POA: Diagnosis not present

## 2023-10-01 DIAGNOSIS — Z23 Encounter for immunization: Secondary | ICD-10-CM | POA: Diagnosis not present

## 2023-10-01 DIAGNOSIS — F900 Attention-deficit hyperactivity disorder, predominantly inattentive type: Secondary | ICD-10-CM | POA: Diagnosis not present

## 2023-10-16 DIAGNOSIS — G43919 Migraine, unspecified, intractable, without status migrainosus: Secondary | ICD-10-CM | POA: Diagnosis not present

## 2023-10-16 DIAGNOSIS — E782 Mixed hyperlipidemia: Secondary | ICD-10-CM | POA: Diagnosis not present

## 2023-10-16 DIAGNOSIS — F331 Major depressive disorder, recurrent, moderate: Secondary | ICD-10-CM | POA: Diagnosis not present

## 2023-10-16 DIAGNOSIS — R7303 Prediabetes: Secondary | ICD-10-CM | POA: Diagnosis not present

## 2023-10-22 DIAGNOSIS — Z860101 Personal history of adenomatous and serrated colon polyps: Secondary | ICD-10-CM | POA: Diagnosis not present

## 2023-10-22 DIAGNOSIS — Z1211 Encounter for screening for malignant neoplasm of colon: Secondary | ICD-10-CM | POA: Diagnosis not present

## 2023-11-13 DIAGNOSIS — L570 Actinic keratosis: Secondary | ICD-10-CM | POA: Diagnosis not present

## 2023-11-13 DIAGNOSIS — D0359 Melanoma in situ of other part of trunk: Secondary | ICD-10-CM | POA: Diagnosis not present

## 2023-11-13 DIAGNOSIS — D485 Neoplasm of uncertain behavior of skin: Secondary | ICD-10-CM | POA: Diagnosis not present

## 2023-11-15 DIAGNOSIS — R7303 Prediabetes: Secondary | ICD-10-CM | POA: Diagnosis not present

## 2023-11-15 DIAGNOSIS — E782 Mixed hyperlipidemia: Secondary | ICD-10-CM | POA: Diagnosis not present

## 2023-11-15 DIAGNOSIS — G43919 Migraine, unspecified, intractable, without status migrainosus: Secondary | ICD-10-CM | POA: Diagnosis not present

## 2023-11-15 DIAGNOSIS — F331 Major depressive disorder, recurrent, moderate: Secondary | ICD-10-CM | POA: Diagnosis not present

## 2023-11-21 ENCOUNTER — Ambulatory Visit: Admitting: Psychiatry

## 2023-11-25 DIAGNOSIS — B353 Tinea pedis: Secondary | ICD-10-CM | POA: Diagnosis not present

## 2023-12-05 DIAGNOSIS — D0359 Melanoma in situ of other part of trunk: Secondary | ICD-10-CM | POA: Diagnosis not present

## 2023-12-05 DIAGNOSIS — C4359 Malignant melanoma of other part of trunk: Secondary | ICD-10-CM | POA: Diagnosis not present

## 2023-12-05 DIAGNOSIS — L905 Scar conditions and fibrosis of skin: Secondary | ICD-10-CM | POA: Diagnosis not present

## 2023-12-16 DIAGNOSIS — R7303 Prediabetes: Secondary | ICD-10-CM | POA: Diagnosis not present

## 2023-12-16 DIAGNOSIS — G43919 Migraine, unspecified, intractable, without status migrainosus: Secondary | ICD-10-CM | POA: Diagnosis not present

## 2023-12-16 DIAGNOSIS — E782 Mixed hyperlipidemia: Secondary | ICD-10-CM | POA: Diagnosis not present

## 2023-12-16 DIAGNOSIS — F331 Major depressive disorder, recurrent, moderate: Secondary | ICD-10-CM | POA: Diagnosis not present

## 2024-01-08 DIAGNOSIS — F331 Major depressive disorder, recurrent, moderate: Secondary | ICD-10-CM | POA: Diagnosis not present

## 2024-01-08 DIAGNOSIS — Z6831 Body mass index (BMI) 31.0-31.9, adult: Secondary | ICD-10-CM | POA: Diagnosis not present

## 2024-01-08 DIAGNOSIS — F900 Attention-deficit hyperactivity disorder, predominantly inattentive type: Secondary | ICD-10-CM | POA: Diagnosis not present

## 2024-01-08 DIAGNOSIS — Z79891 Long term (current) use of opiate analgesic: Secondary | ICD-10-CM | POA: Diagnosis not present

## 2024-01-08 DIAGNOSIS — E7849 Other hyperlipidemia: Secondary | ICD-10-CM | POA: Diagnosis not present

## 2024-01-08 DIAGNOSIS — E782 Mixed hyperlipidemia: Secondary | ICD-10-CM | POA: Diagnosis not present

## 2024-01-08 DIAGNOSIS — E1165 Type 2 diabetes mellitus with hyperglycemia: Secondary | ICD-10-CM | POA: Diagnosis not present

## 2024-01-16 DIAGNOSIS — G43919 Migraine, unspecified, intractable, without status migrainosus: Secondary | ICD-10-CM | POA: Diagnosis not present

## 2024-01-16 DIAGNOSIS — R7303 Prediabetes: Secondary | ICD-10-CM | POA: Diagnosis not present

## 2024-01-16 DIAGNOSIS — E782 Mixed hyperlipidemia: Secondary | ICD-10-CM | POA: Diagnosis not present

## 2024-01-16 DIAGNOSIS — F331 Major depressive disorder, recurrent, moderate: Secondary | ICD-10-CM | POA: Diagnosis not present

## 2024-01-21 ENCOUNTER — Ambulatory Visit (INDEPENDENT_AMBULATORY_CARE_PROVIDER_SITE_OTHER): Payer: Self-pay | Admitting: Psychiatry

## 2024-01-21 DIAGNOSIS — Z91199 Patient's noncompliance with other medical treatment and regimen due to unspecified reason: Secondary | ICD-10-CM

## 2024-01-21 NOTE — Progress Notes (Signed)
 No show

## 2024-02-12 DIAGNOSIS — B351 Tinea unguium: Secondary | ICD-10-CM | POA: Diagnosis not present

## 2024-02-14 DIAGNOSIS — E782 Mixed hyperlipidemia: Secondary | ICD-10-CM | POA: Diagnosis not present

## 2024-02-14 DIAGNOSIS — G43919 Migraine, unspecified, intractable, without status migrainosus: Secondary | ICD-10-CM | POA: Diagnosis not present

## 2024-02-14 DIAGNOSIS — F331 Major depressive disorder, recurrent, moderate: Secondary | ICD-10-CM | POA: Diagnosis not present

## 2024-02-14 DIAGNOSIS — R7303 Prediabetes: Secondary | ICD-10-CM | POA: Diagnosis not present

## 2024-03-17 DIAGNOSIS — R7303 Prediabetes: Secondary | ICD-10-CM | POA: Diagnosis not present

## 2024-03-17 DIAGNOSIS — E782 Mixed hyperlipidemia: Secondary | ICD-10-CM | POA: Diagnosis not present

## 2024-03-17 DIAGNOSIS — G43919 Migraine, unspecified, intractable, without status migrainosus: Secondary | ICD-10-CM | POA: Diagnosis not present

## 2024-03-17 DIAGNOSIS — F331 Major depressive disorder, recurrent, moderate: Secondary | ICD-10-CM | POA: Diagnosis not present

## 2024-04-17 DIAGNOSIS — R7303 Prediabetes: Secondary | ICD-10-CM | POA: Diagnosis not present

## 2024-04-17 DIAGNOSIS — F331 Major depressive disorder, recurrent, moderate: Secondary | ICD-10-CM | POA: Diagnosis not present

## 2024-04-17 DIAGNOSIS — E782 Mixed hyperlipidemia: Secondary | ICD-10-CM | POA: Diagnosis not present

## 2024-04-17 DIAGNOSIS — G43919 Migraine, unspecified, intractable, without status migrainosus: Secondary | ICD-10-CM | POA: Diagnosis not present

## 2024-04-21 DIAGNOSIS — Z0001 Encounter for general adult medical examination with abnormal findings: Secondary | ICD-10-CM | POA: Diagnosis not present

## 2024-04-21 DIAGNOSIS — R7301 Impaired fasting glucose: Secondary | ICD-10-CM | POA: Diagnosis not present

## 2024-04-21 DIAGNOSIS — E7849 Other hyperlipidemia: Secondary | ICD-10-CM | POA: Diagnosis not present

## 2024-04-21 DIAGNOSIS — I10 Essential (primary) hypertension: Secondary | ICD-10-CM | POA: Diagnosis not present

## 2024-04-21 DIAGNOSIS — Z79891 Long term (current) use of opiate analgesic: Secondary | ICD-10-CM | POA: Diagnosis not present

## 2024-04-21 DIAGNOSIS — R739 Hyperglycemia, unspecified: Secondary | ICD-10-CM | POA: Diagnosis not present

## 2024-04-21 DIAGNOSIS — E1165 Type 2 diabetes mellitus with hyperglycemia: Secondary | ICD-10-CM | POA: Diagnosis not present

## 2024-04-21 DIAGNOSIS — Z1329 Encounter for screening for other suspected endocrine disorder: Secondary | ICD-10-CM | POA: Diagnosis not present

## 2024-05-01 DIAGNOSIS — E1165 Type 2 diabetes mellitus with hyperglycemia: Secondary | ICD-10-CM | POA: Diagnosis not present

## 2024-05-01 DIAGNOSIS — G4733 Obstructive sleep apnea (adult) (pediatric): Secondary | ICD-10-CM | POA: Diagnosis not present

## 2024-05-01 DIAGNOSIS — Z23 Encounter for immunization: Secondary | ICD-10-CM | POA: Diagnosis not present

## 2024-05-01 DIAGNOSIS — Z79891 Long term (current) use of opiate analgesic: Secondary | ICD-10-CM | POA: Diagnosis not present

## 2024-05-01 DIAGNOSIS — G43919 Migraine, unspecified, intractable, without status migrainosus: Secondary | ICD-10-CM | POA: Diagnosis not present

## 2024-05-01 DIAGNOSIS — M5416 Radiculopathy, lumbar region: Secondary | ICD-10-CM | POA: Diagnosis not present

## 2024-05-01 DIAGNOSIS — F900 Attention-deficit hyperactivity disorder, predominantly inattentive type: Secondary | ICD-10-CM | POA: Diagnosis not present

## 2024-05-01 DIAGNOSIS — N3941 Urge incontinence: Secondary | ICD-10-CM | POA: Diagnosis not present

## 2024-05-01 DIAGNOSIS — M797 Fibromyalgia: Secondary | ICD-10-CM | POA: Diagnosis not present

## 2024-05-26 DIAGNOSIS — I781 Nevus, non-neoplastic: Secondary | ICD-10-CM | POA: Diagnosis not present

## 2024-05-26 DIAGNOSIS — B351 Tinea unguium: Secondary | ICD-10-CM | POA: Diagnosis not present

## 2024-05-26 DIAGNOSIS — Z85820 Personal history of malignant melanoma of skin: Secondary | ICD-10-CM | POA: Diagnosis not present

## 2024-05-26 DIAGNOSIS — L570 Actinic keratosis: Secondary | ICD-10-CM | POA: Diagnosis not present
